# Patient Record
Sex: Male | Born: 1970 | Hispanic: No | Marital: Married | Smoking: Current every day smoker
Health system: Southern US, Community
[De-identification: ages and names within clinical notes are randomized; demographics above are authoritative.]

---

## 1998-12-01 ENCOUNTER — Encounter: Payer: Self-pay | Admitting: Emergency Medicine

## 1998-12-01 ENCOUNTER — Emergency Department (HOSPITAL_COMMUNITY): Admission: EM | Admit: 1998-12-01 | Discharge: 1998-12-01 | Payer: Self-pay | Admitting: Emergency Medicine

## 1999-09-25 ENCOUNTER — Encounter: Payer: Self-pay | Admitting: Emergency Medicine

## 1999-09-25 ENCOUNTER — Emergency Department (HOSPITAL_COMMUNITY): Admission: EM | Admit: 1999-09-25 | Discharge: 1999-09-25 | Payer: Self-pay | Admitting: Emergency Medicine

## 1999-09-28 ENCOUNTER — Emergency Department (HOSPITAL_COMMUNITY): Admission: EM | Admit: 1999-09-28 | Discharge: 1999-09-28 | Payer: Self-pay | Admitting: Emergency Medicine

## 2001-10-21 ENCOUNTER — Emergency Department (HOSPITAL_COMMUNITY): Admission: EM | Admit: 2001-10-21 | Discharge: 2001-10-21 | Payer: Self-pay | Admitting: Emergency Medicine

## 2001-10-21 ENCOUNTER — Encounter: Payer: Self-pay | Admitting: Emergency Medicine

## 2003-06-09 ENCOUNTER — Ambulatory Visit (HOSPITAL_COMMUNITY): Admission: RE | Admit: 2003-06-09 | Discharge: 2003-06-09 | Payer: Self-pay | Admitting: Internal Medicine

## 2005-04-19 ENCOUNTER — Ambulatory Visit: Payer: Self-pay | Admitting: Family Medicine

## 2005-04-27 ENCOUNTER — Ambulatory Visit: Payer: Self-pay | Admitting: *Deleted

## 2005-05-02 ENCOUNTER — Ambulatory Visit: Payer: Self-pay | Admitting: Family Medicine

## 2005-08-28 ENCOUNTER — Ambulatory Visit: Payer: Self-pay | Admitting: Family Medicine

## 2007-03-15 ENCOUNTER — Ambulatory Visit: Payer: Self-pay | Admitting: Internal Medicine

## 2007-03-15 LAB — CONVERTED CEMR LAB
ALT: 66 units/L — ABNORMAL HIGH (ref 0–53)
AST: 32 units/L (ref 0–37)
Basophils Absolute: 0 10*3/uL (ref 0.0–0.1)
Basophils Relative: 0 % (ref 0–1)
Chloride: 104 meq/L (ref 96–112)
Creatinine, Ser: 0.73 mg/dL (ref 0.40–1.50)
Eosinophils Relative: 3 % (ref 0–5)
Hemoglobin: 16 g/dL (ref 13.0–17.0)
LDL Cholesterol: 129 mg/dL — ABNORMAL HIGH (ref 0–99)
MCHC: 34.7 g/dL (ref 30.0–36.0)
Monocytes Absolute: 0.6 10*3/uL (ref 0.2–0.7)
Neutro Abs: 3.8 10*3/uL (ref 1.7–7.7)
RDW: 11.8 % (ref 11.5–14.0)
Total Bilirubin: 0.7 mg/dL (ref 0.3–1.2)
Total CHOL/HDL Ratio: 4.4
VLDL: 39 mg/dL (ref 0–40)

## 2007-04-02 ENCOUNTER — Ambulatory Visit: Payer: Self-pay | Admitting: Internal Medicine

## 2007-06-25 ENCOUNTER — Ambulatory Visit: Payer: Self-pay | Admitting: Internal Medicine

## 2007-06-25 LAB — CONVERTED CEMR LAB
AST: 42 units/L — ABNORMAL HIGH (ref 0–37)
Albumin: 4.7 g/dL (ref 3.5–5.2)
BUN: 11 mg/dL (ref 6–23)
Basophils Relative: 0 % (ref 0–1)
Calcium: 10 mg/dL (ref 8.4–10.5)
Chloride: 103 meq/L (ref 96–112)
Glucose, Bld: 114 mg/dL — ABNORMAL HIGH (ref 70–99)
HDL: 52 mg/dL (ref >39)
Helicobacter Pylori Antibody-IgG: 1.1 — ABNORMAL HIGH
Lymphs Abs: 2.7 10*3/uL (ref 0.7–4.0)
Monocytes Relative: 7 % (ref 3–12)
Neutro Abs: 3.7 10*3/uL (ref 1.7–7.7)
Neutrophils Relative %: 51 % (ref 43–77)
Potassium: 3.9 meq/L (ref 3.5–5.3)
RBC: 5.52 M/uL (ref 4.22–5.81)
Total Protein: 7.9 g/dL (ref 6.0–8.3)
WBC: 7.2 10*3/uL (ref 4.0–10.5)

## 2007-06-26 ENCOUNTER — Ambulatory Visit (HOSPITAL_COMMUNITY): Admission: RE | Admit: 2007-06-26 | Discharge: 2007-06-26 | Payer: Self-pay | Admitting: Family Medicine

## 2007-08-05 ENCOUNTER — Ambulatory Visit: Payer: Self-pay | Admitting: Internal Medicine

## 2007-08-05 LAB — CONVERTED CEMR LAB
HCV Ab: NEGATIVE
Hep A Total Ab: POSITIVE — AB
Hep B S Ab: NEGATIVE

## 2007-11-18 ENCOUNTER — Ambulatory Visit: Payer: Self-pay | Admitting: Internal Medicine

## 2007-11-18 ENCOUNTER — Encounter (INDEPENDENT_AMBULATORY_CARE_PROVIDER_SITE_OTHER): Payer: Self-pay | Admitting: Family Medicine

## 2007-11-18 LAB — CONVERTED CEMR LAB
AST: 38 units/L — ABNORMAL HIGH (ref 0–37)
Albumin: 4.7 g/dL (ref 3.5–5.2)
Alkaline Phosphatase: 105 units/L (ref 39–117)
BUN: 10 mg/dL (ref 6–23)
Creatinine, Ser: 0.74 mg/dL (ref 0.40–1.50)
HDL: 54 mg/dL (ref >39)
LDL Cholesterol: 70 mg/dL (ref 0–99)
Potassium: 4.4 meq/L (ref 3.5–5.3)
Total Bilirubin: 0.7 mg/dL (ref 0.3–1.2)
Total CHOL/HDL Ratio: 3.1
Triglycerides: 220 mg/dL — ABNORMAL HIGH (ref <150)
VLDL: 44 mg/dL — ABNORMAL HIGH (ref 0–40)

## 2008-12-17 ENCOUNTER — Encounter: Admission: RE | Admit: 2008-12-17 | Discharge: 2008-12-17 | Payer: Self-pay | Admitting: Internal Medicine

## 2010-04-20 ENCOUNTER — Emergency Department (HOSPITAL_COMMUNITY)
Admission: EM | Admit: 2010-04-20 | Discharge: 2010-04-20 | Payer: Self-pay | Source: Home / Self Care | Admitting: Emergency Medicine

## 2010-08-09 LAB — URINALYSIS, ROUTINE W REFLEX MICROSCOPIC
Bilirubin Urine: NEGATIVE
Ketones, ur: NEGATIVE mg/dL
Nitrite: NEGATIVE
Protein, ur: NEGATIVE mg/dL
Urobilinogen, UA: 0.2 mg/dL (ref 0.0–1.0)

## 2010-08-09 LAB — CBC
MCH: 32.1 pg (ref 26.0–34.0)
MCHC: 34.9 g/dL (ref 30.0–36.0)
Platelets: 189 10*3/uL (ref 150–400)
RDW: 12.1 % (ref 11.5–15.5)

## 2010-08-09 LAB — BASIC METABOLIC PANEL
BUN: 6 mg/dL (ref 6–23)
CO2: 23 mEq/L (ref 19–32)
Calcium: 9.2 mg/dL (ref 8.4–10.5)
GFR calc non Af Amer: >60 mL/min (ref >60)
Glucose, Bld: 93 mg/dL (ref 70–99)

## 2010-08-09 LAB — POCT CARDIAC MARKERS
CKMB, poc: <1 ng/mL — ABNORMAL LOW (ref 1.0–8.0)
CKMB, poc: <1 ng/mL — ABNORMAL LOW (ref 1.0–8.0)
Myoglobin, poc: 46.3 ng/mL (ref 12–200)

## 2010-08-09 LAB — HEPATIC FUNCTION PANEL
Albumin: 3.8 g/dL (ref 3.5–5.2)
Indirect Bilirubin: 0.6 mg/dL (ref 0.3–0.9)
Total Protein: 6.8 g/dL (ref 6.0–8.3)

## 2010-08-09 LAB — DIFFERENTIAL
Basophils Relative: 1 % (ref 0–1)
Eosinophils Absolute: 0.2 10*3/uL (ref 0.0–0.7)
Neutrophils Relative %: 58 % (ref 43–77)

## 2010-08-09 LAB — PROTIME-INR
INR: 1.02 (ref 0.00–1.49)
Prothrombin Time: 13.6 seconds (ref 11.6–15.2)

## 2014-07-21 ENCOUNTER — Ambulatory Visit (INDEPENDENT_AMBULATORY_CARE_PROVIDER_SITE_OTHER): Payer: PRIVATE HEALTH INSURANCE | Admitting: Family Medicine

## 2014-07-21 ENCOUNTER — Ambulatory Visit: Payer: Self-pay

## 2014-07-21 VITALS — BP 146/78 | HR 76 | Temp 98.4°F | Resp 17 | Ht 70.0 in | Wt 216.0 lb

## 2014-07-21 DIAGNOSIS — L03811 Cellulitis of head [any part, except face]: Secondary | ICD-10-CM

## 2014-07-21 MED ORDER — DOXYCYCLINE HYCLATE 100 MG PO CAPS: 100.0000 mg | ORAL_CAPSULE | Freq: Two times a day (BID) | ORAL | Status: DC

## 2014-07-21 NOTE — Patient Instructions (Addendum)
Take the doxycycline one twice daily  Use warm compresses over the area on the scalp that is infected  Return at anytime if worse.  Come back if we can be of further assistance on other problems.

## 2014-07-21 NOTE — Progress Notes (Signed)
Verbal Consent Obtained. Local anesthesia with 2 cc of 2% lidocaine with epi.  11 blade used to incise the lesion centrally.  No purulence was expressed. Culture obtained. Small incision made so no packing placed. Cleansed. No dressing applied as wound was not bleeding upon pt leaving.

## 2014-07-21 NOTE — Progress Notes (Signed)
Subjective: [age] year old [place] man who is here because he noticed a lump on the back of his scalp on the left. This was first noted a few days ago. It has been tender and getting a little larger.  Objective: 3-4 cm area of superficial induration and erythema. It is tender to touch. It is shiny in some areas. It is not coming to a head yet. The hair is still present over the whole thing. It is on the left occipital region.  Assessment: Early abscess left scalp  Plan: I&D Doxycycline twice a day

## 2014-07-24 LAB — WOUND CULTURE
Gram Stain: NONE SEEN
Gram Stain: NONE SEEN
Organism ID, Bacteria: NO GROWTH

## 2014-12-03 ENCOUNTER — Emergency Department (HOSPITAL_COMMUNITY): Payer: Self-pay

## 2014-12-03 ENCOUNTER — Emergency Department (HOSPITAL_COMMUNITY)
Admission: EM | Admit: 2014-12-03 | Discharge: 2014-12-03 | Disposition: A | Discharge status: Home / Self Care | Payer: Self-pay | Attending: Emergency Medicine | Admitting: Emergency Medicine

## 2014-12-03 ENCOUNTER — Encounter (HOSPITAL_COMMUNITY): Payer: Self-pay | Admitting: Emergency Medicine

## 2014-12-03 DIAGNOSIS — Z720 Tobacco use: Secondary | ICD-10-CM | POA: Insufficient documentation

## 2014-12-03 DIAGNOSIS — Z792 Long term (current) use of antibiotics: Secondary | ICD-10-CM | POA: Insufficient documentation

## 2014-12-03 DIAGNOSIS — S3992XA Unspecified injury of lower back, initial encounter: Secondary | ICD-10-CM | POA: Insufficient documentation

## 2014-12-03 DIAGNOSIS — Y9389 Activity, other specified: Secondary | ICD-10-CM | POA: Insufficient documentation

## 2014-12-03 DIAGNOSIS — Y998 Other external cause status: Secondary | ICD-10-CM | POA: Insufficient documentation

## 2014-12-03 DIAGNOSIS — S199XXA Unspecified injury of neck, initial encounter: Secondary | ICD-10-CM | POA: Insufficient documentation

## 2014-12-03 DIAGNOSIS — R42 Dizziness and giddiness: Secondary | ICD-10-CM | POA: Insufficient documentation

## 2014-12-03 DIAGNOSIS — Y92410 Unspecified street and highway as the place of occurrence of the external cause: Secondary | ICD-10-CM | POA: Insufficient documentation

## 2014-12-03 MED ORDER — IBUPROFEN 800 MG PO TABS: 800.0000 mg | ORAL_TABLET | Freq: Three times a day (TID) | ORAL | Status: DC

## 2014-12-03 MED ORDER — METHOCARBAMOL 500 MG PO TABS: 500.0000 mg | ORAL_TABLET | Freq: Two times a day (BID) | ORAL | Status: DC

## 2014-12-03 NOTE — ED Notes (Signed)
Pt reports pain up neck and midback. Denies LOC as scene. Denies head injury. No air back deployment
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
## 2014-12-03 NOTE — Discharge Instructions (Signed)

## 2018-01-14 ENCOUNTER — Ambulatory Visit: Payer: Self-pay | Admitting: Emergency Medicine

## 2018-01-14 ENCOUNTER — Encounter: Payer: Self-pay | Admitting: Emergency Medicine

## 2018-01-14 ENCOUNTER — Other Ambulatory Visit: Payer: Self-pay

## 2018-01-14 VITALS — BP 134/80 | HR 83 | Temp 98.4°F | Resp 16 | Ht 70.0 in | Wt 212.0 lb

## 2018-01-14 DIAGNOSIS — B359 Dermatophytosis, unspecified: Secondary | ICD-10-CM

## 2018-01-14 DIAGNOSIS — R21 Rash and other nonspecific skin eruption: Secondary | ICD-10-CM | POA: Insufficient documentation

## 2018-01-14 MED ORDER — CLOTRIMAZOLE-BETAMETHASONE 1-0.05 % EX CREA
1.0000 | TOPICAL_CREAM | Freq: Two times a day (BID) | CUTANEOUS | 2 refills | Status: DC
Start: 2018-01-14 — End: 2020-05-13

## 2018-01-14 NOTE — Patient Instructions (Addendum)
   If you have lab work done today you will be contacted with your lab results within the next 2 weeks.  If you have not heard from us then please contact us. The fastest way to get your results is to register for My Chart.   IF you received an x-ray today, you will receive an invoice from [facility]. Please contact [facility] at [phone] with questions or concerns regarding your invoice.   IF you received labwork today, you will receive an invoice from [facility]. Please contact [facility] at [phone] with questions or concerns regarding your invoice.   Our billing staff will not be able to assist you with questions regarding bills from these companies.  You will be contacted with the lab results as soon as they are available. The fastest way to get your results is to activate your My Chart account. Instructions are located on the last page of this paperwork. If you have not heard from us regarding the results in 2 weeks, please contact this office.     Body Ringworm Body ringworm is an infection of the skin that often causes a ring-shaped rash. Body ringworm can affect any part of your skin. It can spread easily to others. Body ringworm is also called tinea corporis. What are the causes? This condition is caused by funguses called dermatophytes. The condition develops when these funguses grow out of control on the skin. You can get this condition if you touch a person or animal that has it. You can also get it if you share clothing, bedding, towels, or any other object with an infected person or pet. What increases the risk? This condition is more likely to develop in:  Athletes who often make skin-to-skin contact with other athletes, such as wrestlers.  People who share equipment and mats.  People with a weakened immune system.  What are the signs or symptoms? Symptoms of this condition include:  Itchy, raised red spots and bumps.  Red scaly  patches.  A ring-shaped rash. The rash may have: ? A clear center. ? Scales or red bumps at its center. ? Redness near its borders. ? Dry and scaly skin on or around it.  How is this diagnosed? This condition can usually be diagnosed with a skin exam. A skin scraping may be taken from the affected area and examined under a microscope to see if the fungus is present. How is this treated? This condition may be treated with:  An antifungal cream or ointment.  An antifungal shampoo.  Antifungal medicines. These may be prescribed if your ringworm is severe, keeps coming back, or lasts a long time.  Follow these instructions at home:  Take over-the-counter and prescription medicines only as told by your health care provider.  If you were given an antifungal cream or ointment: ? Use it as told by your health care provider. ? Wash the infected area and dry it completely before applying the cream or ointment.  If you were given an antifungal shampoo: ? Use it as told by your health care provider. ? Leave the shampoo on your body for 3-5 minutes before rinsing.  While you have a rash: ? Wear loose clothing to stop clothes from rubbing and irritating it. ? Wash or change your bed sheets every night.  If your pet has the same infection, take your pet to see a veterinarian. How is this prevented?  Practice good hygiene.  Wear sandals or shoes in public places and showers.    Do not share personal items with others.  Avoid touching red patches of skin on other people.  Avoid touching pets that have bald spots.  If you touch an animal that has a bald spot, wash your hands. Contact a health care provider if:  Your rash continues to spread after 7 days of treatment.  Your rash is not gone in 4 weeks.  The area around your rash gets red, warm, tender, and swollen. This information is not intended to replace advice given to you by your health care provider. Make sure you discuss  any questions you have with your health care provider. Document Released: 05/12/2000 Document Revised: 10/21/2015 Document Reviewed: 03/11/2015 Elsevier Interactive Patient Education  2018 Elsevier Inc.  

## 2018-01-14 NOTE — Progress Notes (Signed)
Matthew Weeks 47 y.o.   Chief Complaint  Patient presents with  . Rash    small circluar rash on rt leg/ x 2 month. dose not itch or hurt    HISTORY OF PRESENT ILLNESS: This is a 47 y.o. male complaining of itchy rash to his right lower leg for the past 2 months.  Denies trauma although it did start with a slight cut.  No other significant symptoms.  No history of diabetes or any other chronic medical problem.  HPI   Prior to Admission medications   Medication Sig Start Date End Date Taking? Authorizing Provider  doxycycline (VIBRAMYCIN) 100 MG capsule Take 1 capsule (100 mg total) by mouth 2 (two) times daily. Patient not taking: Reported on 12/03/2014 07/21/14   Matthew Weeks, [name], MD  ibuprofen (ADVIL,MOTRIN) 800 MG tablet Take 1 tablet (800 mg total) by mouth 3 (three) times daily. Patient not taking: Reported on 01/14/2018 12/03/14   [name], [name], [name]  methocarbamol (ROBAXIN) 500 MG tablet Take 1 tablet (500 mg total) by mouth 2 (two) times daily. Patient not taking: Reported on 01/14/2018 12/03/14   [name], [name], [name]    No Known Allergies  There are no active problems to display for this patient.   No past medical history on file.  No past surgical history on file.  Social History   Socioeconomic History  . Marital status: Married    Spouse name: Not on file  . Number of children: Not on file  . Years of education: Not on file  . Highest education level: Not on file  Occupational History  . Not on file  Social Needs  . Financial resource strain: Not on file  . Food insecurity:    Worry: Not on file    Inability: Not on file  . Transportation needs:    Medical: Not on file    Non-medical: Not on file  Tobacco Use  . Smoking status: Current Every Day Smoker    Packs/day: 1.00    Years: 20.00    Pack years: 20.00    Types: Cigarettes  Substance and Sexual Activity  . Alcohol use: No    Alcohol/week: 0.0 standard drinks  . Drug use: Not on file  . Sexual  activity: Not on file  Lifestyle  . Physical activity:    Days per week: Not on file    Minutes per session: Not on file  . Stress: Not on file  Relationships  . Social connections:    Talks on phone: Not on file    Gets together: Not on file    Attends religious service: Not on file    Active member of club or organization: Not on file    Attends meetings of clubs or organizations: Not on file    Relationship status: Not on file  . Intimate partner violence:    Fear of current or ex partner: Not on file    Emotionally abused: Not on file    Physically abused: Not on file    Forced sexual activity: Not on file  Other Topics Concern  . Not on file  Social History Narrative  . Not on file    No family history on file.   Review of Systems  Constitutional: Negative.  Negative for chills and fever.  HENT: Negative.  Negative for sore throat.   Eyes: Negative.  Negative for discharge and redness.  Respiratory: Negative.  Negative for cough and shortness of breath.   Cardiovascular: Negative.  Negative for chest pain and palpitations.  Gastrointestinal: Negative for abdominal pain, diarrhea, nausea and vomiting.  Genitourinary: Negative.  Negative for dysuria.  Musculoskeletal: Negative for back pain, myalgias and neck pain.  Skin: Positive for itching and rash.  Neurological: Negative for dizziness and headaches.  All other systems reviewed and are negative.   Vitals:   01/14/18 0853  BP: 134/80  Pulse: 83  Resp: 16  Temp: 98.4 F (36.9 C)  SpO2: 98%    Physical Exam  Constitutional: He is oriented to person, place, and time. He appears well-developed and well-nourished.  HENT:  Head: Normocephalic.  Eyes: Pupils are equal, round, and reactive to light.  Neck: Normal range of motion.  Cardiovascular: Normal rate and regular rhythm.  Pulmonary/Chest: Effort normal.  Musculoskeletal: Normal range of motion. He exhibits no edema or tenderness.  Neurological: He is  alert and oriented to person, place, and time.  Skin: Skin is warm and dry. Capillary refill takes less than 2 seconds. Rash noted.  Psychiatric: He has a normal mood and affect. His behavior is normal.  Vitals reviewed.  .    ASSESSMENT & PLAN: [name] was seen today for rash.  Diagnoses and all orders for this visit:  Ringworm -     clotrimazole-betamethasone (LOTRISONE) cream; Apply 1 application topically 2 (two) times daily.  Rash and nonspecific skin eruption    Patient Instructions       If you have lab work done today you will be contacted with your lab results within the next 2 weeks.  If you have not heard from [place] then please contact us. The fastest way to get your results is to register for My Chart.   IF you received an x-ray today, you will receive an invoice from [facility]. Please contact [facility] at [phone] with questions or concerns regarding your invoice.   IF you received labwork today, you will receive an invoice from [facility]. Please contact [facility] at [phone] with questions or concerns regarding your invoice.   Our billing staff will not be able to assist you with questions regarding bills from these companies.  You will be contacted with the lab results as soon as they are available. The fastest way to get your results is to activate your My Chart account. Instructions are located on the last page of this paperwork. If you have not heard from [place] regarding the results in 2 weeks, please contact this office.      Body Ringworm Body ringworm is an infection of the skin that often causes a ring-shaped rash. Body ringworm can affect any part of your skin. It can spread easily to others. Body ringworm is also called tinea corporis. What are the causes? This condition is caused by funguses called dermatophytes. The condition develops when these funguses grow out of control on the skin. You can get this condition if you touch  a person or animal that has it. You can also get it if you share clothing, bedding, towels, or any other object with an infected person or pet. What increases the risk? This condition is more likely to develop in:  Athletes who often make skin-to-skin contact with other athletes, such as wrestlers.  People who share equipment and mats.  People with a weakened immune system.  What are the signs or symptoms? Symptoms of this condition include:  Itchy, raised red spots and bumps.  Red scaly patches.  A ring-shaped rash. The rash may have: ? A clear center. ?  Scales or red bumps at its center. ? Redness near its borders. ? Dry and scaly skin on or around it.  How is this diagnosed? This condition can usually be diagnosed with a skin exam. A skin scraping may be taken from the affected area and examined under a microscope to see if the fungus is present. How is this treated? This condition may be treated with:  An antifungal cream or ointment.  An antifungal shampoo.  Antifungal medicines. These may be prescribed if your ringworm is severe, keeps coming back, or lasts a long time.  Follow these instructions at home:  Take over-the-counter and prescription medicines only as told by your health care provider.  If you were given an antifungal cream or ointment: ? Use it as told by your health care provider. ? Wash the infected area and dry it completely before applying the cream or ointment.  If you were given an antifungal shampoo: ? Use it as told by your health care provider. ? Leave the shampoo on your body for 3-5 minutes before rinsing.  While you have a rash: ? Wear loose clothing to stop clothes from rubbing and irritating it. ? Wash or change your bed sheets every night.  If your pet has the same infection, take your pet to see a International aid/development workerveterinarian. How is this prevented?  Practice good hygiene.  Wear sandals or shoes in public places and showers.  Do not share  personal items with others.  Avoid touching red patches of skin on other people.  Avoid touching pets that have bald spots.  If you touch an animal that has a bald spot, wash your hands. Contact a health care provider if:  Your rash continues to spread after 7 days of treatment.  Your rash is not gone in 4 weeks.  The area around your rash gets red, warm, tender, and swollen. This information is not intended to replace advice given to you by your health care provider. Make sure you discuss any questions you have with your health care provider. Document Released: 05/12/2000 Document Revised: 10/21/2015 Document Reviewed: 03/11/2015 Elsevier Interactive Patient Education  2018 Elsevier Inc.      Edwina BarthMiguel Nayda Riesen, MD [facility]

## 2018-08-15 ENCOUNTER — Other Ambulatory Visit: Payer: Self-pay

## 2018-08-15 ENCOUNTER — Emergency Department (HOSPITAL_COMMUNITY): Payer: Self-pay

## 2018-08-15 ENCOUNTER — Emergency Department (HOSPITAL_COMMUNITY)
Admission: EM | Admit: 2018-08-15 | Discharge: 2018-08-15 | Disposition: A | Discharge status: Home / Self Care | Payer: Self-pay | Attending: Emergency Medicine | Admitting: Emergency Medicine

## 2018-08-15 ENCOUNTER — Encounter (HOSPITAL_COMMUNITY): Payer: Self-pay

## 2018-08-15 DIAGNOSIS — Y92410 Unspecified street and highway as the place of occurrence of the external cause: Secondary | ICD-10-CM | POA: Insufficient documentation

## 2018-08-15 DIAGNOSIS — Z79899 Other long term (current) drug therapy: Secondary | ICD-10-CM | POA: Insufficient documentation

## 2018-08-15 DIAGNOSIS — F17210 Nicotine dependence, cigarettes, uncomplicated: Secondary | ICD-10-CM | POA: Insufficient documentation

## 2018-08-15 DIAGNOSIS — Y9389 Activity, other specified: Secondary | ICD-10-CM | POA: Insufficient documentation

## 2018-08-15 DIAGNOSIS — S46912A Strain of unspecified muscle, fascia and tendon at shoulder and upper arm level, left arm, initial encounter: Secondary | ICD-10-CM | POA: Insufficient documentation

## 2018-08-15 DIAGNOSIS — Y999 Unspecified external cause status: Secondary | ICD-10-CM | POA: Insufficient documentation

## 2018-08-15 MED ORDER — IBUPROFEN 800 MG PO TABS: 800.0000 mg | ORAL_TABLET | Freq: Three times a day (TID) | ORAL | 0 refills | Status: DC | PRN
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
## 2019-01-14 ENCOUNTER — Ambulatory Visit: Payer: Self-pay | Admitting: Emergency Medicine

## 2019-01-15 ENCOUNTER — Ambulatory Visit: Payer: Self-pay | Admitting: Emergency Medicine

## 2019-01-16 ENCOUNTER — Other Ambulatory Visit: Payer: Self-pay

## 2019-01-16 ENCOUNTER — Ambulatory Visit: Payer: Self-pay | Admitting: Emergency Medicine

## 2019-01-16 ENCOUNTER — Ambulatory Visit (INDEPENDENT_AMBULATORY_CARE_PROVIDER_SITE_OTHER): Payer: Self-pay

## 2019-01-16 ENCOUNTER — Ambulatory Visit (INDEPENDENT_AMBULATORY_CARE_PROVIDER_SITE_OTHER): Payer: Self-pay | Admitting: Emergency Medicine

## 2019-01-16 ENCOUNTER — Encounter: Payer: Self-pay | Admitting: Emergency Medicine

## 2019-01-16 VITALS — BP 122/77 | HR 76 | Temp 98.1°F | Resp 16 | Ht 70.0 in | Wt 205.6 lb

## 2019-01-16 DIAGNOSIS — S43401A Unspecified sprain of right shoulder joint, initial encounter: Secondary | ICD-10-CM

## 2019-01-16 DIAGNOSIS — M25511 Pain in right shoulder: Secondary | ICD-10-CM

## 2019-01-16 MED ORDER — MELOXICAM 7.5 MG PO TABS: 7.5000 mg | ORAL_TABLET | Freq: Every day | ORAL | 0 refills | Status: DC
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
## 2019-01-16 NOTE — Patient Instructions (Addendum)
   If you have lab work done today you will be contacted with your lab results within the next 2 weeks.  If you have not heard from us then please contact us. The fastest way to get your results is to register for My Chart.   IF you received an x-ray today, you will receive an invoice from [facility]. Please contact [facility] at [phone] with questions or concerns regarding your invoice.   IF you received labwork today, you will receive an invoice from [facility]. Please contact [facility] at [phone] with questions or concerns regarding your invoice.   Our billing staff will not be able to assist you with questions regarding bills from these companies.  You will be contacted with the lab results as soon as they are available. The fastest way to get your results is to activate your My Chart account. Instructions are located on the last page of this paperwork. If you have not heard from us regarding the results in 2 weeks, please contact this office.     Shoulder Pain Many things can cause shoulder pain, including:  An injury.  Moving the shoulder in the same way again and again (overuse).  Joint pain (arthritis). Pain can come from:  Swelling and irritation (inflammation) of any part of the shoulder.  An injury to the shoulder joint.  An injury to: ? Tissues that connect muscle to bone (tendons). ? Tissues that connect bones to each other (ligaments). ? Bones. Follow these instructions at home: Watch for changes in your symptoms. Let your doctor know about them. Follow these instructions to help with your pain. If you have a sling:  Wear the sling as told by your doctor. Remove it only as told by your doctor.  Loosen the sling if your fingers: ? Tingle. ? Become numb. ? Turn cold and blue.  Keep the sling clean.  If the sling is not waterproof: ? Do not let it get wet. ? Take the sling off when you shower or bathe. Managing pain, stiffness,  and swelling   If told, put ice on the painful area: ? Put ice in a plastic bag. ? Place a towel between your skin and the bag. ? Leave the ice on for 20 minutes, 2-3 times a day. Stop putting ice on if it does not help with the pain.  Squeeze a soft ball or a foam pad as much as possible. This prevents swelling in the shoulder. It also helps to strengthen the arm. General instructions  Take over-the-counter and prescription medicines only as told by your doctor.  Keep all follow-up visits as told by your doctor. This is important. Contact a doctor if:  Your pain gets worse.  Medicine does not help your pain.  You have new pain in your arm, hand, or fingers. Get help right away if:  Your arm, hand, or fingers: ? Tingle. ? Are numb. ? Are swollen. ? Are painful. ? Turn white or blue. Summary  Shoulder pain can be caused by many things. These include injury, moving the shoulder in the same away again and again, and joint pain.  Watch for changes in your symptoms. Let your doctor know about them.  This condition may be treated with a sling, ice, and pain medicine.  Contact your doctor if the pain gets worse or you have new pain. Get help right away if your arm, hand, or fingers tingle or get numb, swollen, or painful.  Keep all follow-up visits   as told by your doctor. This is important. This information is not intended to replace advice given to you by your health care provider. Make sure you discuss any questions you have with your health care provider. Document Released: 11/01/2007 Document Revised: 11/27/2017 Document Reviewed: 11/27/2017 Elsevier Patient Education  2020 [employer]  

## 2019-07-03 DIAGNOSIS — Z20822 Contact with and (suspected) exposure to covid-19: Secondary | ICD-10-CM | POA: Diagnosis not present

## 2019-07-03 DIAGNOSIS — Z20828 Contact with and (suspected) exposure to other viral communicable diseases: Secondary | ICD-10-CM | POA: Diagnosis not present

## 2019-09-18 DIAGNOSIS — Z87891 Personal history of nicotine dependence: Secondary | ICD-10-CM | POA: Diagnosis not present

## 2019-09-18 DIAGNOSIS — R55 Syncope and collapse: Secondary | ICD-10-CM | POA: Diagnosis not present

## 2019-09-18 DIAGNOSIS — R9431 Abnormal electrocardiogram [ECG] [EKG]: Secondary | ICD-10-CM | POA: Diagnosis not present

## 2019-09-18 DIAGNOSIS — Z79899 Other long term (current) drug therapy: Secondary | ICD-10-CM | POA: Diagnosis not present

## 2019-09-18 DIAGNOSIS — F17210 Nicotine dependence, cigarettes, uncomplicated: Secondary | ICD-10-CM | POA: Diagnosis not present

## 2019-09-18 DIAGNOSIS — R001 Bradycardia, unspecified: Secondary | ICD-10-CM | POA: Diagnosis not present

## 2019-09-18 DIAGNOSIS — M479 Spondylosis, unspecified: Secondary | ICD-10-CM | POA: Diagnosis not present

## 2019-09-18 DIAGNOSIS — R531 Weakness: Secondary | ICD-10-CM | POA: Diagnosis not present

## 2019-09-18 DIAGNOSIS — R42 Dizziness and giddiness: Secondary | ICD-10-CM | POA: Diagnosis not present

## 2019-09-18 DIAGNOSIS — R4020 Unspecified coma: Secondary | ICD-10-CM | POA: Diagnosis not present

## 2019-09-25 ENCOUNTER — Encounter: Payer: Self-pay | Admitting: Emergency Medicine

## 2019-09-25 ENCOUNTER — Other Ambulatory Visit: Payer: Self-pay

## 2019-09-25 ENCOUNTER — Ambulatory Visit: Payer: BC Managed Care – PPO | Admitting: Emergency Medicine

## 2019-09-25 ENCOUNTER — Telehealth: Payer: Self-pay | Admitting: *Deleted

## 2019-09-25 VITALS — BP 126/78 | HR 60 | Temp 97.4°F | Resp 16 | Ht 70.0 in | Wt 209.0 lb

## 2019-09-25 DIAGNOSIS — F17200 Nicotine dependence, unspecified, uncomplicated: Secondary | ICD-10-CM | POA: Diagnosis not present

## 2019-09-25 DIAGNOSIS — Z1321 Encounter for screening for nutritional disorder: Secondary | ICD-10-CM

## 2019-09-25 DIAGNOSIS — Z1211 Encounter for screening for malignant neoplasm of colon: Secondary | ICD-10-CM | POA: Diagnosis not present

## 2019-09-25 DIAGNOSIS — Z13220 Encounter for screening for lipoid disorders: Secondary | ICD-10-CM

## 2019-09-25 DIAGNOSIS — Z716 Tobacco abuse counseling: Secondary | ICD-10-CM

## 2019-09-25 DIAGNOSIS — Z130 Encounter for screening for diseases of the blood and blood-forming organs and certain disorders involving the immune mechanism: Secondary | ICD-10-CM

## 2019-09-25 DIAGNOSIS — Z1329 Encounter for screening for other suspected endocrine disorder: Secondary | ICD-10-CM

## 2019-09-25 DIAGNOSIS — Z13228 Encounter for screening for other metabolic disorders: Secondary | ICD-10-CM

## 2019-09-25 LAB — LIPID PANEL
Chol/HDL Ratio: 3.5 ratio (ref 0.0–5.0)
Cholesterol, Total: 184 mg/dL (ref 100–199)
HDL: 53 mg/dL (ref >39)
LDL Chol Calc (NIH): 113 mg/dL — ABNORMAL HIGH (ref 0–99)
Triglycerides: 99 mg/dL (ref 0–149)
VLDL Cholesterol Cal: 18 mg/dL (ref 5–40)

## 2019-09-25 LAB — COMPREHENSIVE METABOLIC PANEL
ALT: 41 IU/L (ref 0–44)
AST: 27 IU/L (ref 0–40)
Albumin/Globulin Ratio: 1.8 (ref 1.2–2.2)
Albumin: 4.4 g/dL (ref 4.0–5.0)
Alkaline Phosphatase: 101 IU/L (ref 39–117)
BUN/Creatinine Ratio: 10 (ref 9–20)
BUN: 8 mg/dL (ref 6–24)
Bilirubin Total: 0.6 mg/dL (ref 0.0–1.2)
CO2: 18 mmol/L — ABNORMAL LOW (ref 20–29)
Calcium: 9.3 mg/dL (ref 8.7–10.2)
Chloride: 103 mmol/L (ref 96–106)
Creatinine, Ser: 0.77 mg/dL (ref 0.76–1.27)
GFR calc Af Amer: 122 mL/min/{1.73_m2} (ref >59)
GFR calc non Af Amer: 106 mL/min/{1.73_m2} (ref >59)
Globulin, Total: 2.4 g/dL (ref 1.5–4.5)
Glucose: 105 mg/dL — ABNORMAL HIGH (ref 65–99)
Potassium: 4 mmol/L (ref 3.5–5.2)
Sodium: 139 mmol/L (ref 134–144)
Total Protein: 6.8 g/dL (ref 6.0–8.5)

## 2019-09-25 LAB — HEMOGLOBIN A1C
Est. average glucose Bld gHb Est-mCnc: 97 mg/dL
Hgb A1c MFr Bld: 5 % (ref 4.8–5.6)

## 2019-09-25 NOTE — Patient Instructions (Addendum)
   If you have lab work done today you will be contacted with your lab results within the next 2 weeks.  If you have not heard from us then please contact us. The fastest way to get your results is to register for My Chart.   IF you received an x-ray today, you will receive an invoice from [facility]. Please contact [facility] at [phone] with questions or concerns regarding your invoice.   IF you received labwork today, you will receive an invoice from [facility]. Please contact [facility] at [phone] with questions or concerns regarding your invoice.   Our billing staff will not be able to assist you with questions regarding bills from these companies.  You will be contacted with the lab results as soon as they are available. The fastest way to get your results is to activate your My Chart account. Instructions are located on the last page of this paperwork. If you have not heard from us regarding the results in 2 weeks, please contact this office.      Health Maintenance, Male Adopting a healthy lifestyle and getting preventive care are important in promoting health and wellness. Ask your health care provider about:  The right schedule for you to have regular tests and exams.  Things you can do on your own to prevent diseases and keep yourself healthy. What should I know about diet, weight, and exercise? Eat a healthy diet   Eat a diet that includes plenty of vegetables, fruits, low-fat dairy products, and lean protein.  Do not eat a lot of foods that are high in solid fats, added sugars, or sodium. Maintain a healthy weight Body mass index (BMI) is a measurement that can be used to identify possible weight problems. It estimates body fat based on height and weight. Your health care provider can help determine your BMI and help you achieve or maintain a healthy weight. Get regular exercise Get regular exercise. This is one of the most important things you  can do for your health. Most adults should:  Exercise for at least 150 minutes each week. The exercise should increase your heart rate and make you sweat (moderate-intensity exercise).  Do strengthening exercises at least twice a week. This is in addition to the moderate-intensity exercise.  Spend less time sitting. Even light physical activity can be beneficial. Watch cholesterol and blood lipids Have your blood tested for lipids and cholesterol at 50 years of age, then have this test every 5 years. You may need to have your cholesterol levels checked more often if:  Your lipid or cholesterol levels are high.  You are older than 50 years of age.  You are at high risk for heart disease. What should I know about cancer screening? Many types of cancers can be detected early and may often be prevented. Depending on your health history and family history, you may need to have cancer screening at various ages. This may include screening for:  Colorectal cancer.  Prostate cancer.  Skin cancer.  Lung cancer. What should I know about heart disease, diabetes, and high blood pressure? Blood pressure and heart disease  High blood pressure causes heart disease and increases the risk of stroke. This is more likely to develop in people who have high blood pressure readings, are of African descent, or are overweight.  Talk with your health care provider about your target blood pressure readings.  Have your blood pressure checked: ? Every 3-5 years if you are 18-39   years of age. ? Every year if you are 40 years old or older.  If you are between the ages of 65 and 75 and are a current or former smoker, ask your health care provider if you should have a one-time screening for abdominal aortic aneurysm (AAA). Diabetes Have regular diabetes screenings. This checks your fasting blood sugar level. Have the screening done:  Once every three years after age 45 if you are at a normal weight and have  a low risk for diabetes.  More often and at a younger age if you are overweight or have a high risk for diabetes. What should I know about preventing infection? Hepatitis B If you have a higher risk for hepatitis B, you should be screened for this virus. Talk with your health care provider to find out if you are at risk for hepatitis B infection. Hepatitis C Blood testing is recommended for:  Everyone born from 1945 through 1965.  Anyone with known risk factors for hepatitis C. Sexually transmitted infections (STIs)  You should be screened each year for STIs, including gonorrhea and chlamydia, if: ? You are sexually active and are younger than 50 years of age. ? You are older than 50 years of age and your health care provider tells you that you are at risk for this type of infection. ? Your sexual activity has changed since you were last screened, and you are at increased risk for chlamydia or gonorrhea. Ask your health care provider if you are at risk.  Ask your health care provider about whether you are at high risk for HIV. Your health care provider may recommend a prescription medicine to help prevent HIV infection. If you choose to take medicine to prevent HIV, you should first get tested for HIV. You should then be tested every 3 months for as long as you are taking the medicine. Follow these instructions at home: Lifestyle  Do not use any products that contain nicotine or tobacco, such as cigarettes, e-cigarettes, and chewing tobacco. If you need help quitting, ask your health care provider.  Do not use street drugs.  Do not share needles.  Ask your health care provider for help if you need support or information about quitting drugs. Alcohol use  Do not drink alcohol if your health care provider tells you not to drink.  If you drink alcohol: ? Limit how much you have to 0-2 drinks a day. ? Be aware of how much alcohol is in your drink. In the [place] one drink equals one 12  oz bottle of beer (355 mL), one 5 oz glass of wine (148 mL), or one 1 oz glass of hard liquor (44 mL). General instructions  Schedule regular health, dental, and eye exams.  Stay current with your vaccines.  Tell your health care provider if: ? You often feel depressed. ? You have ever been abused or do not feel safe at home. Summary  Adopting a healthy lifestyle and getting preventive care are important in promoting health and wellness.  Follow your health care provider's instructions about healthy diet, exercising, and getting tested or screened for diseases.  Follow your health care provider's instructions on monitoring your cholesterol and blood pressure. This information is not intended to replace advice given to you by your health care provider. Make sure you discuss any questions you have with your health care provider. Document Revised: 05/08/2018 Document Reviewed: 05/08/2018 Elsevier Patient Education  2020 Elsevier Inc.  

## 2019-09-25 NOTE — Progress Notes (Signed)
Matthew Weeks 49 y.o.   Chief Complaint  Patient presents with  . screening for hyperlipidemia    per pt he want cholesterol checked    HISTORY OF PRESENT ILLNESS: This is a 49 y.o. male current smoker wants to be tested for cholesterol. No history of chronic medical problems. Emergency department visit to [facility] on 09/18/2019 complaining of weakness and dizziness during Ramadan season. Had extensive work-up which was negative and it included blood work, EKG, chest x-ray, CT scan of brain. Feels better today and perhaps he was dehydrated. No other complaints or medical concerns today.  HPI   Prior to Admission medications   Medication Sig Start Date End Date Taking? Authorizing Provider  ibuprofen (ADVIL,MOTRIN) 800 MG tablet Take 1 tablet (800 mg total) by mouth every 8 (eight) hours as needed. 08/15/18  Yes Tilden Fossa, MD  clotrimazole-betamethasone (LOTRISONE) cream Apply 1 application topically 2 (two) times daily. Patient not taking: Reported on 09/25/2019 01/14/18   Georgina Quint, MD  meloxicam (MOBIC) 7.5 MG tablet Take 1 tablet (7.5 mg total) by mouth daily. Patient not taking: Reported on 09/25/2019 01/16/19   Georgina Quint, MD  methocarbamol (ROBAXIN) 500 MG tablet Take 1 tablet (500 mg total) by mouth 2 (two) times daily. Patient not taking: Reported on 09/25/2019 12/03/14   Fayrene Helper, [name]    No Known Allergies  There are no problems to display for this patient.   History reviewed. No pertinent past medical history.  History reviewed. No pertinent surgical history.  Social History   Socioeconomic History  . Marital status: Married    Spouse name: Not on file  . Number of children: Not on file  . Years of education: Not on file  . Highest education level: Not on file  Occupational History  . Not on file  Tobacco Use  . Smoking status: Current Every Day Smoker    Packs/day: 1.00    Years: 20.00    Pack years: 20.00   Types: Cigarettes  . Smokeless tobacco: Never Used  Substance and Sexual Activity  . Alcohol use: No    Alcohol/week: 0.0 standard drinks  . Drug use: Never  . Sexual activity: Not on file  Other Topics Concern  . Not on file  Social History Narrative  . Not on file   Social Determinants of Health   Financial Resource Strain:   . Difficulty of Paying Living Expenses:   Food Insecurity:   . Worried About Programme researcher, broadcasting/film/video in the Last Year:   . Barista in the Last Year:   Transportation Needs:   . Freight forwarder (Medical):   Marland Kitchen Lack of Transportation (Non-Medical):   Physical Activity:   . Days of Exercise per Week:   . Minutes of Exercise per Session:   Stress:   . Feeling of Stress :   Social Connections:   . Frequency of Communication with Friends and Family:   . Frequency of Social Gatherings with Friends and Family:   . Attends Religious Services:   . Active Member of Clubs or Organizations:   . Attends Banker Meetings:   Marland Kitchen Marital Status:   Intimate Partner Violence:   . Fear of Current or Ex-Partner:   . Emotionally Abused:   Marland Kitchen Physically Abused:   . Sexually Abused:     History reviewed. No pertinent family history.   Review of Systems  Constitutional: Negative.  Negative for chills and fever.  HENT: Negative.  Negative for congestion and sore throat.   Respiratory: Negative.  Negative for cough and shortness of breath.   Cardiovascular: Negative.  Negative for chest pain and palpitations.  Gastrointestinal: Negative.  Negative for abdominal pain, blood in stool, constipation, diarrhea, melena, nausea and vomiting.  Genitourinary: Negative.  Negative for dysuria and hematuria.  Musculoskeletal: Negative for back pain, myalgias and neck pain.  Skin: Negative.  Negative for rash.  Neurological: Negative.  Negative for dizziness and headaches.  All other systems reviewed and are negative.  Today's Vitals   09/25/19 1017  BP:  126/78  Pulse: 60  Resp: 16  Temp: (!) 97.4 F (36.3 C)  TempSrc: Temporal  SpO2: 99%  Weight: 209 lb (94.8 kg)  Height: 5\' 10"  (1.778 m)   Body mass index is 29.99 kg/m.   Physical Exam Vitals reviewed.  Constitutional:      Appearance: Normal appearance.  HENT:     Head: Normocephalic.  Eyes:     Extraocular Movements: Extraocular movements intact.     Conjunctiva/sclera: Conjunctivae normal.     Pupils: Pupils are equal, round, and reactive to light.  Cardiovascular:     Rate and Rhythm: Normal rate and regular rhythm.     Pulses: Normal pulses.     Heart sounds: Normal heart sounds.  Pulmonary:     Effort: Pulmonary effort is normal.     Breath sounds: Normal breath sounds.  Abdominal:     Palpations: Abdomen is soft.     Tenderness: There is no abdominal tenderness.  Musculoskeletal:        General: Normal range of motion.     Cervical back: Normal range of motion and neck supple.  Skin:    General: Skin is warm and dry.     Capillary Refill: Capillary refill takes less than 2 seconds.  Neurological:     General: No focal deficit present.     Mental Status: He is alert and oriented to person, place, and time.  Psychiatric:        Mood and Affect: Mood normal.        Behavior: Behavior normal.    A total of 30 minutes was spent with the patient, greater than 50% of which was in counseling/coordination of care regarding health maintenance, smoking cessation, cardiovascular risk associated with smoking as well as increased risk for malignancies and COPD, review of most recent office visit notes, review of most recent blood work, need for diagnostic and screening blood work, diet and nutrition, prognosis and need for follow-up.   ASSESSMENT & PLAN: Matthew Weeks was seen today for screening for hyperlipidemia.  Diagnoses and all orders for this visit:  Current smoker  Screening for colon cancer -     Cologuard  Screening for lipoid disorders -     Lipid  panel  Screening for endocrine, nutritional, metabolic and immunity disorder -     Comprehensive metabolic panel -     Hemoglobin A1c  Tobacco abuse counseling -     Ambulatory referral to Smoking Cessation Program    Patient Instructions       If you have lab work done today you will be contacted with your lab results within the next 2 weeks.  If you have not heard from then please contact us. The fastest way to get your results is to register for My Chart.   IF you received an x-ray today, you will receive an invoice from [facility]. Please contact  [facility] at [phone] with questions or concerns regarding your invoice.   IF you received labwork today, you will receive an invoice from [facility]. Please contact [facility] at [phone] with questions or concerns regarding your invoice.   Our billing staff will not be able to assist you with questions regarding bills from these companies.  You will be contacted with the lab results as soon as they are available. The fastest way to get your results is to activate your My Chart account. Instructions are located on the last page of this paperwork. If you have not heard from [place] regarding the results in 2 weeks, please contact this office.     Health Maintenance, Male Adopting a healthy lifestyle and getting preventive care are important in promoting health and wellness. Ask your health care provider about:  The right schedule for you to have regular tests and exams.  Things you can do on your own to prevent diseases and keep yourself healthy. What should I know about diet, weight, and exercise? Eat a healthy diet   Eat a diet that includes plenty of vegetables, fruits, low-fat dairy products, and lean protein.  Do not eat a lot of foods that are high in solid fats, added sugars, or sodium. Maintain a healthy weight Body mass index (BMI) is a measurement that can be used to identify possible weight  problems. It estimates body fat based on height and weight. Your health care provider can help determine your BMI and help you achieve or maintain a healthy weight. Get regular exercise Get regular exercise. This is one of the most important things you can do for your health. Most adults should:  Exercise for at least 150 minutes each week. The exercise should increase your heart rate and make you sweat (moderate-intensity exercise).  Do strengthening exercises at least twice a week. This is in addition to the moderate-intensity exercise.  Spend less time sitting. Even light physical activity can be beneficial. Watch cholesterol and blood lipids Have your blood tested for lipids and cholesterol at 50 years of age, then have this test every 5 years. You may need to have your cholesterol levels checked more often if:  Your lipid or cholesterol levels are high.  You are older than 50 years of age.  You are at high risk for heart disease. What should I know about cancer screening? Many types of cancers can be detected early and may often be prevented. Depending on your health history and family history, you may need to have cancer screening at various ages. This may include screening for:  Colorectal cancer.  Prostate cancer.  Skin cancer.  Lung cancer. What should I know about heart disease, diabetes, and high blood pressure? Blood pressure and heart disease  High blood pressure causes heart disease and increases the risk of stroke. This is more likely to develop in people who have high blood pressure readings, are of African descent, or are overweight.  Talk with your health care provider about your target blood pressure readings.  Have your blood pressure checked: ? Every 3-5 years if you are 22-52 years of age. ? Every year if you are 53 years old or older.  If you are between the ages of 13 and 20 and are a current or former smoker, ask your health care provider if you should  have a one-time screening for abdominal aortic aneurysm (AAA). Diabetes Have regular diabetes screenings. This checks your fasting blood sugar level. Have the screening done:  Once every three years after age 54 if you are at a normal weight and have a low risk for diabetes.  More often and at a younger age if you are overweight or have a high risk for diabetes. What should I know about preventing infection? Hepatitis B If you have a higher risk for hepatitis B, you should be screened for this virus. Talk with your health care provider to find out if you are at risk for hepatitis B infection. Hepatitis C Blood testing is recommended for:  Everyone born from [birth year] through 1965.  Anyone with known risk factors for hepatitis C. Sexually transmitted infections (STIs)  You should be screened each year for STIs, including gonorrhea and chlamydia, if: ? You are sexually active and are younger than 50 years of age. ? You are older than 50 years of age and your health care provider tells you that you are at risk for this type of infection. ? Your sexual activity has changed since you were last screened, and you are at increased risk for chlamydia or gonorrhea. Ask your health care provider if you are at risk.  Ask your health care provider about whether you are at high risk for HIV. Your health care provider may recommend a prescription medicine to help prevent HIV infection. If you choose to take medicine to prevent HIV, you should first get tested for HIV. You should then be tested every 3 months for as long as you are taking the medicine. Follow these instructions at home: Lifestyle  Do not use any products that contain nicotine or tobacco, such as cigarettes, e-cigarettes, and chewing tobacco. If you need help quitting, ask your health care provider.  Do not use street drugs.  Do not share needles.  Ask your health care provider for help if you need support or information about quitting  drugs. Alcohol use  Do not drink alcohol if your health care provider tells you not to drink.  If you drink alcohol: ? Limit how much you have to 0-2 drinks a day. ? Be aware of how much alcohol is in your drink. In the [place] one drink equals one 12 oz bottle of beer (355 mL), one 5 oz glass of wine (148 mL), or one 1 oz glass of hard liquor (44 mL). General instructions  Schedule regular health, dental, and eye exams.  Stay current with your vaccines.  Tell your health care provider if: ? You often feel depressed. ? You have ever been abused or do not feel safe at home. Summary  Adopting a healthy lifestyle and getting preventive care are important in promoting health and wellness.  Follow your health care provider's instructions about healthy diet, exercising, and getting tested or screened for diseases.  Follow your health care provider's instructions on monitoring your cholesterol and blood pressure. This information is not intended to replace advice given to you by your health care provider. Make sure you discuss any questions you have with your health care provider. Document Revised: 05/08/2018 Document Reviewed: 05/08/2018 Elsevier Patient Education  2020 Elsevier Inc.      Edwina Barth, MD [facility]

## 2019-09-25 NOTE — Telephone Encounter (Signed)
Faxed Cologuard Req to [employer]. confirmation page 10:38 am.

## 2019-09-29 ENCOUNTER — Telehealth: Payer: Self-pay | Admitting: Emergency Medicine

## 2019-09-29 NOTE — Telephone Encounter (Signed)
Spoke with pt, gave lab results all is well. Also told pt that several calls were attempted and that he needs to set up vm for future communication.

## 2019-09-29 NOTE — Progress Notes (Signed)
Spoke with pt, all is well

## 2019-09-29 NOTE — Telephone Encounter (Signed)
Pt would like a call back concerning his most recent lab results. Please advise at [phone].

## 2019-10-03 ENCOUNTER — Ambulatory Visit: Payer: BC Managed Care – PPO | Admitting: Registered Nurse

## 2019-10-08 ENCOUNTER — Ambulatory Visit (INDEPENDENT_AMBULATORY_CARE_PROVIDER_SITE_OTHER): Payer: BC Managed Care – PPO | Admitting: Emergency Medicine

## 2019-10-08 ENCOUNTER — Other Ambulatory Visit: Payer: Self-pay

## 2019-10-08 ENCOUNTER — Encounter: Payer: Self-pay | Admitting: Emergency Medicine

## 2019-10-08 VITALS — BP 115/72 | HR 64 | Temp 97.1°F | Ht 70.0 in | Wt 206.4 lb

## 2019-10-08 DIAGNOSIS — F17200 Nicotine dependence, unspecified, uncomplicated: Secondary | ICD-10-CM

## 2019-10-08 DIAGNOSIS — R42 Dizziness and giddiness: Secondary | ICD-10-CM

## 2019-10-08 NOTE — Progress Notes (Signed)
Matthew Weeks 49 y.o.   Chief Complaint  Patient presents with  . Dizziness    3-4 days   . check eyes and ears    wants a check up on them to make sure they work properly    HISTORY OF PRESENT ILLNESS: This is a 49 y.o. male complaining of intermittent episodes of dizziness since Ramadan started.  Has been fasting 13-1/2 hours every day since.  Went to emergency room last month and had extensive negative and normal work-up.  Seen by me on 09/25/2019 for follow-up.  No significant findings.  Negative work-up.  Normal blood work results. Has a Dr. niece in [place] who advised to have his eyes and ears checked. No new symptoms since I saw him 2 weeks ago. No other complaints or significant medical concerns.  HPI   Prior to Admission medications   Medication Sig Start Date End Date Taking? Authorizing Provider  clotrimazole-betamethasone (LOTRISONE) cream Apply 1 application topically 2 (two) times daily. Patient not taking: Reported on 09/25/2019 01/14/18   Georgina Quint, MD  ibuprofen (ADVIL,MOTRIN) 800 MG tablet Take 1 tablet (800 mg total) by mouth every 8 (eight) hours as needed. Patient not taking: Reported on 10/08/2019 08/15/18   Tilden Fossa, MD  meloxicam (MOBIC) 7.5 MG tablet Take 1 tablet (7.5 mg total) by mouth daily. Patient not taking: Reported on 09/25/2019 01/16/19   Georgina Quint, MD  methocarbamol (ROBAXIN) 500 MG tablet Take 1 tablet (500 mg total) by mouth 2 (two) times daily. Patient not taking: Reported on 09/25/2019 12/03/14   Fayrene Helper, [name]    No Known Allergies  There are no problems to display for this patient.   History reviewed. No pertinent past medical history.  History reviewed. No pertinent surgical history.  Social History   Socioeconomic History  . Marital status: Married    Spouse name: Not on file  . Number of children: Not on file  . Years of education: Not on file  . Highest education level: Not on file  Occupational  History  . Not on file  Tobacco Use  . Smoking status: Current Every Day Smoker    Packs/day: 1.00    Years: 20.00    Pack years: 20.00    Types: Cigarettes  . Smokeless tobacco: Never Used  Substance and Sexual Activity  . Alcohol use: No    Alcohol/week: 0.0 standard drinks  . Drug use: Never  . Sexual activity: Not on file  Other Topics Concern  . Not on file  Social History Narrative  . Not on file   Social Determinants of Health   Financial Resource Strain:   . Difficulty of Paying Living Expenses:   Food Insecurity:   . Worried About Programme researcher, broadcasting/film/video in the Last Year:   . Barista in the Last Year:   Transportation Needs:   . Freight forwarder (Medical):   Marland Kitchen Lack of Transportation (Non-Medical):   Physical Activity:   . Days of Exercise per Week:   . Minutes of Exercise per Session:   Stress:   . Feeling of Stress :   Social Connections:   . Frequency of Communication with Friends and Family:   . Frequency of Social Gatherings with Friends and Family:   . Attends Religious Services:   . Active Member of Clubs or Organizations:   . Attends Banker Meetings:   Marland Kitchen Marital Status:   Intimate Partner Violence:   . Fear  of Current or Ex-Partner:   . Emotionally Abused:   Marland Kitchen Physically Abused:   . Sexually Abused:     History reviewed. No pertinent family history.   Review of Systems  Constitutional: Negative.   HENT: Negative.  Negative for congestion, nosebleeds and sore throat.   Eyes: Negative.  Negative for blurred vision, double vision and pain.  Respiratory: Negative.  Negative for cough and shortness of breath.   Cardiovascular: Negative.  Negative for chest pain and palpitations.  Gastrointestinal: Negative.  Negative for abdominal pain, diarrhea, nausea and vomiting.  Genitourinary: Negative.  Negative for dysuria and hematuria.  Skin: Negative.  Negative for rash.  Neurological: Positive for dizziness. Negative for  sensory change, focal weakness, weakness and headaches.  Endo/Heme/Allergies: Negative.   All other systems reviewed and are negative.  Today's Vitals   10/08/19 0838  BP: 115/72  Pulse: 64  Temp: (!) 97.1 F (36.2 C)  TempSrc: Temporal  SpO2: 97%  Weight: 206 lb 6.4 oz (93.6 kg)  Height: 5\' 10"  (1.778 m)   Body mass index is 29.62 kg/m.   Physical Exam Vitals reviewed.  Constitutional:      Appearance: Normal appearance.  HENT:     Head: Normocephalic.     Right Ear: Tympanic membrane, ear canal and external ear normal.     Left Ear: Tympanic membrane, ear canal and external ear normal.     Mouth/Throat:     Mouth: Mucous membranes are moist.     Pharynx: Oropharynx is clear.  Eyes:     General: Lids are normal.     Extraocular Movements: Extraocular movements intact.     Conjunctiva/sclera: Conjunctivae normal.     Pupils: Pupils are equal, round, and reactive to light.     Funduscopic exam:    Right eye: No hemorrhage, exudate or papilledema.        Left eye: No hemorrhage, exudate or papilledema.     Visual Fields: Right eye visual fields normal and left eye visual fields normal.  Neck:     Vascular: No carotid bruit.     Comments: Left sided old surgical scar Cardiovascular:     Rate and Rhythm: Normal rate and regular rhythm.     Pulses: Normal pulses.     Heart sounds: Normal heart sounds.  Pulmonary:     Effort: Pulmonary effort is normal.     Breath sounds: Normal breath sounds.  Musculoskeletal:        General: Normal range of motion.     Cervical back: Normal range of motion and neck supple.  Lymphadenopathy:     Cervical: No cervical adenopathy.  Skin:    General: Skin is warm and dry.     Capillary Refill: Capillary refill takes less than 2 seconds.  Neurological:     General: No focal deficit present.     Mental Status: He is alert and oriented to person, place, and time.     Cranial Nerves: No cranial nerve deficit.     Sensory: No sensory  deficit.     Motor: No weakness.     Coordination: Coordination normal.     Gait: Gait normal.     Deep Tendon Reflexes: Reflexes normal.  Psychiatric:        Mood and Affect: Mood normal.        Behavior: Behavior normal.    A total of 30 minutes was spent with the patient, greater than 50% of which was in counseling/coordination of care  regarding differential diagnosis of dizziness, review of most recent office visit notes, review of most recent blood work results, review of lifestyle, diet and nutrition, importance of hydration, importance of quitting smoking and cardiovascular risks associated with smoking as well as different cancers, prognosis and need for follow-up if no better or worse during the next several weeks..   ASSESSMENT & PLAN: Saurabh was seen today for dizziness and check eyes and ears.  Diagnoses and all orders for this visit:  Dizziness  Current smoker    Patient Instructions       If you have lab work done today you will be contacted with your lab results within the next 2 weeks.  If you have not heard from [place] then please contact us. The fastest way to get your results is to register for My Chart.   IF you received an x-ray today, you will receive an invoice from [facility]. Please contact [facility] at [phone] with questions or concerns regarding your invoice.   IF you received labwork today, you will receive an invoice from [facility]. Please contact [facility] at [phone] with questions or concerns regarding your invoice.   Our billing staff will not be able to assist you with questions regarding bills from these companies.  You will be contacted with the lab results as soon as they are available. The fastest way to get your results is to activate your My Chart account. Instructions are located on the last page of this paperwork. If you have not heard from [place] regarding the results in 2 weeks, please contact this office.       Dizziness Dizziness is a common problem. It makes you feel unsteady or light-headed. You may feel like you are about to pass out (faint). Dizziness can lead to getting hurt if you stumble or fall. Dizziness can be caused by many things, including:  Medicines.  Not having enough water in your body (dehydration).  Illness. Follow these instructions at home: Eating and drinking   Drink enough fluid to keep your pee (urine) clear or pale yellow. This helps to keep you from getting dehydrated. Try to drink more clear fluids, such as water.  Do not drink alcohol.  Limit how much caffeine you drink or eat, if your doctor tells you to do that.  Limit how much salt (sodium) you drink or eat, if your doctor tells you to do that. Activity   Avoid making quick movements. ? When you stand up from sitting in a chair, steady yourself until you feel okay. ? In the morning, first sit up on the side of the bed. When you feel okay, stand slowly while you hold onto something. Do this until you know that your balance is fine.  If you need to stand in one place for a long time, move your legs often. Tighten and relax the muscles in your legs while you are standing.  Do not drive or use heavy machinery if you feel dizzy.  Avoid bending down if you feel dizzy. Place items in your home so you can reach them easily without leaning over. Lifestyle  Do not use any products that contain nicotine or tobacco, such as cigarettes and e-cigarettes. If you need help quitting, ask your doctor.  Try to lower your stress level. You can do this by using methods such as yoga or meditation. Talk with your doctor if you need help. General instructions  Watch your dizziness for any changes.  Take over-the-counter and prescription  medicines only as told by your doctor. Talk with your doctor if you think that you are dizzy because of a medicine that you are taking.  Tell a friend or a family member that you are  feeling dizzy. If he or she notices any changes in your behavior, have this person call your doctor.  Keep all follow-up visits as told by your doctor. This is important. Contact a doctor if:  Your dizziness does not go away.  Your dizziness or light-headedness gets worse.  You feel sick to your stomach (nauseous).  You have trouble hearing.  You have new symptoms.  You are unsteady on your feet.  You feel like the room is spinning. Get help right away if:  You throw up (vomit) or have watery poop (diarrhea), and you cannot eat or drink anything.  You have trouble: ? Talking. ? Walking. ? Swallowing. ? Using your arms, hands, or legs.  You feel generally weak.  You are not thinking clearly, or you have trouble forming sentences. A friend or family member may notice this.  You have: ? Chest pain. ? Pain in your belly (abdomen). ? Shortness of breath. ? Sweating.  Your vision changes.  You are bleeding.  You have a very bad headache.  You have neck pain or a stiff neck.  You have a fever. These symptoms may be an emergency. Do not wait to see if the symptoms will go away. Get medical help right away. Call your local emergency services (911 in the [place] Do not drive yourself to the hospital. Summary  Dizziness makes you feel unsteady or light-headed. You may feel like you are about to pass out (faint).  Drink enough fluid to keep your pee (urine) clear or pale yellow. Do not drink alcohol.  Avoid making quick movements if you feel dizzy.  Watch your dizziness for any changes. This information is not intended to replace advice given to you by your health care provider. Make sure you discuss any questions you have with your health care provider. Document Revised: 05/18/2017 Document Reviewed: 06/01/2016 Elsevier Patient Education  2020 Elsevier Inc.      Edwina Barth, MD [facility]

## 2019-10-08 NOTE — Patient Instructions (Addendum)
   If you have lab work done today you will be contacted with your lab results within the next 2 weeks.  If you have not heard from us then please contact us. The fastest way to get your results is to register for My Chart.   IF you received an x-ray today, you will receive an invoice from [facility]. Please contact  Radiology at [phone] with questions or concerns regarding your invoice.   IF you received labwork today, you will receive an invoice from [facility]. Please contact [facility] at [phone] with questions or concerns regarding your invoice.   Our billing staff will not be able to assist you with questions regarding bills from these companies.  You will be contacted with the lab results as soon as they are available. The fastest way to get your results is to activate your My Chart account. Instructions are located on the last page of this paperwork. If you have not heard from us regarding the results in 2 weeks, please contact this office.     Dizziness Dizziness is a common problem. It makes you feel unsteady or light-headed. You may feel like you are about to pass out (faint). Dizziness can lead to getting hurt if you stumble or fall. Dizziness can be caused by many things, including:  Medicines.  Not having enough water in your body (dehydration).  Illness. Follow these instructions at home: Eating and drinking   Drink enough fluid to keep your pee (urine) clear or pale yellow. This helps to keep you from getting dehydrated. Try to drink more clear fluids, such as water.  Do not drink alcohol.  Limit how much caffeine you drink or eat, if your doctor tells you to do that.  Limit how much salt (sodium) you drink or eat, if your doctor tells you to do that. Activity   Avoid making quick movements. ? When you stand up from sitting in a chair, steady yourself until you feel okay. ? In the morning, first sit up on the side of the bed. When  you feel okay, stand slowly while you hold onto something. Do this until you know that your balance is fine.  If you need to stand in one place for a long time, move your legs often. Tighten and relax the muscles in your legs while you are standing.  Do not drive or use heavy machinery if you feel dizzy.  Avoid bending down if you feel dizzy. Place items in your home so you can reach them easily without leaning over. Lifestyle  Do not use any products that contain nicotine or tobacco, such as cigarettes and e-cigarettes. If you need help quitting, ask your doctor.  Try to lower your stress level. You can do this by using methods such as yoga or meditation. Talk with your doctor if you need help. General instructions  Watch your dizziness for any changes.  Take over-the-counter and prescription medicines only as told by your doctor. Talk with your doctor if you think that you are dizzy because of a medicine that you are taking.  Tell a friend or a family member that you are feeling dizzy. If he or she notices any changes in your behavior, have this person call your doctor.  Keep all follow-up visits as told by your doctor. This is important. Contact a doctor if:  Your dizziness does not go away.  Your dizziness or light-headedness gets worse.  You feel sick to your stomach (nauseous).  You   have trouble hearing.  You have new symptoms.  You are unsteady on your feet.  You feel like the room is spinning. Get help right away if:  You throw up (vomit) or have watery poop (diarrhea), and you cannot eat or drink anything.  You have trouble: ? Talking. ? Walking. ? Swallowing. ? Using your arms, hands, or legs.  You feel generally weak.  You are not thinking clearly, or you have trouble forming sentences. A friend or family member may notice this.  You have: ? Chest pain. ? Pain in your belly (abdomen). ? Shortness of breath. ? Sweating.  Your vision changes.  You  are bleeding.  You have a very bad headache.  You have neck pain or a stiff neck.  You have a fever. These symptoms may be an emergency. Do not wait to see if the symptoms will go away. Get medical help right away. Call your local emergency services (911 in the [place] Do not drive yourself to the hospital. Summary  Dizziness makes you feel unsteady or light-headed. You may feel like you are about to pass out (faint).  Drink enough fluid to keep your pee (urine) clear or pale yellow. Do not drink alcohol.  Avoid making quick movements if you feel dizzy.  Watch your dizziness for any changes. This information is not intended to replace advice given to you by your health care provider. Make sure you discuss any questions you have with your health care provider. Document Revised: 05/18/2017 Document Reviewed: 06/01/2016 Elsevier Patient Education  2020 Elsevier Inc.  

## 2019-10-29 ENCOUNTER — Ambulatory Visit: Payer: BC Managed Care – PPO | Admitting: Registered Nurse

## 2019-10-30 ENCOUNTER — Encounter: Payer: Self-pay | Admitting: Registered Nurse

## 2019-10-30 ENCOUNTER — Encounter: Payer: Self-pay | Admitting: Family Medicine

## 2019-10-30 ENCOUNTER — Other Ambulatory Visit: Payer: Self-pay

## 2019-10-30 ENCOUNTER — Ambulatory Visit (INDEPENDENT_AMBULATORY_CARE_PROVIDER_SITE_OTHER): Payer: BC Managed Care – PPO | Admitting: Family Medicine

## 2019-10-30 VITALS — BP 125/83 | HR 77 | Temp 97.9°F | Ht 70.0 in | Wt 210.2 lb

## 2019-10-30 DIAGNOSIS — H6993 Unspecified Eustachian tube disorder, bilateral: Secondary | ICD-10-CM

## 2019-10-30 DIAGNOSIS — H9193 Unspecified hearing loss, bilateral: Secondary | ICD-10-CM | POA: Diagnosis not present

## 2019-10-30 DIAGNOSIS — H6983 Other specified disorders of Eustachian tube, bilateral: Secondary | ICD-10-CM

## 2019-10-30 DIAGNOSIS — R42 Dizziness and giddiness: Secondary | ICD-10-CM | POA: Diagnosis not present

## 2019-10-30 MED ORDER — PREDNISONE 20 MG PO TABS
ORAL_TABLET | ORAL | 0 refills | Status: DC
Start: 2019-10-30 — End: 2020-03-18

## 2019-10-30 MED ORDER — FLUTICASONE PROPIONATE 50 MCG/ACT NA SUSP: 2.0000 | Freq: Every day | NASAL | 6 refills | Status: DC

## 2019-10-30 MED ORDER — MECLIZINE HCL 12.5 MG PO TABS: 12.5000 mg | ORAL_TABLET | Freq: Three times a day (TID) | ORAL | 0 refills | Status: DC | PRN

## 2019-10-30 NOTE — Patient Instructions (Addendum)
  Referral is being made to the ear nose and throat doctor for evaluation of your hearing and dizziness.  If you do not hear from our referrals desk in the next 6 or 7 days call back out here and ask what became of your referral.  I have prescribed something called meclizine that you can take 1 or 2 pills 3 times daily if needed for dizziness  I have prescribed prednisone which is a cortisone medication to try and help open up the eustachian tubes.  Take 3 pills daily for 2 days, then 2 daily for 2 days, then 1 daily for 2 days.  Take all of the pills simultaneously, not spread out through the day.  Best taken in the morning with food, but I would take your first 3 pills today after eating something.  Use the fluticasone nose spray 2 sprays each nostril at bedtime  Return if worse or go to an emergency room or urgent care if necessary.   If you have lab work done today you will be contacted with your lab results within the next 2 weeks.  If you have not heard from [place] then please contact us. The fastest way to get your results is to register for My Chart.   IF you received an x-ray today, you will receive an invoice from [facility]. Please contact [facility] at [phone] with questions or concerns regarding your invoice.   IF you received labwork today, you will receive an invoice from [place]. Please contact [facility] at [phone] with questions or concerns regarding your invoice.   Our billing staff will not be able to assist you with questions regarding bills from these companies.  You will be contacted with the lab results as soon as they are available. The fastest way to get your results is to activate your My Chart account. Instructions are located on the last page of this paperwork. If you have not heard from [place] regarding the results in 2 weeks, please contact this office.

## 2019-10-30 NOTE — Progress Notes (Signed)
Patient ID: Matthew Weeks, male    DOB: 10-11-1970  Age: 49 y.o. MRN: [id number]  Chief Complaint  Patient presents with  . Otalgia    Pt stated that he has been having some Rt ear pain for the last month. He stated that he can not hardly hear out of it and when he moves too fast/too much he will get dizzy    Subjective:  Patient is here for for further problems with his dizziness and hearing.  He is not actually having ear pain.  He has had the sense of decreased hearing.  This is been fairly acute.  He has occasional headache but that is relieved by Tylenol.  He has not had any major staggering but he feels unsteady from the dizziness.  Recently done labs all look pretty good  Current allergies, medications, problem list, past/family and social histories reviewed.  Objective:  BP 125/83 (BP Location: Right Arm, Patient Position: Sitting, Cuff Size: Normal)   Pulse 77   Temp 97.9 F (36.6 C) (Temporal)   Ht 5\' 10"  (1.778 m)   Wt 210 lb 3.2 oz (95.3 kg)   SpO2 96%   BMI 30.16 kg/m   No acute distress.  Alert and oriented.  Vital signs were stable with blood pressure 125/83.  His TMs appear fairly normal, maybe a little retracted on the left.  Can only see the superior portion of the right drum.  No carotid bruits.  Large scar in his left neck from some surgery had followed up with chemotherapy many years ago.  I wonder if it was some kind of a lymphatic tumor but he did not think it was a cancer.  Assessment & Plan:   Assessment: 1. Decreased hearing of both ears   2. Dysfunction of both eustachian tubes   3. Dizziness       Plan: See instructions  Orders Placed This Encounter  Procedures  . Ambulatory referral to ENT    Referral Priority:   Routine    Referral Type:   Consultation    Referral Reason:   Specialty Services Required    Requested Specialty:   Otolaryngology    Number of Visits Requested:   1    Meds ordered this encounter  Medications  . meclizine  (ANTIVERT) 12.5 MG tablet    Sig: Take 1 tablet (12.5 mg total) by mouth 3 (three) times daily as needed for dizziness.    Dispense:  30 tablet    Refill:  0  . predniSONE (DELTASONE) 20 MG tablet    Sig: Take 3 each morning for 2 days, then 2 for 2 days, then 1 for 2 days for eustachian tubes    Dispense:  12 tablet    Refill:  0  . fluticasone (FLONASE) 50 MCG/ACT nasal spray    Sig: Place 2 sprays into both nostrils daily.    Dispense:  16 g    Refill:  6         Patient Instructions    Referral is being made to the ear nose and throat doctor for evaluation of your hearing and dizziness.  If you do not hear from our referrals desk in the next 6 or 7 days call back out here and ask what became of your referral.  I have prescribed something called meclizine that you can take 1 or 2 pills 3 times daily if needed for dizziness  I have prescribed prednisone which is a cortisone medication to try and help  open up the eustachian tubes.  Take 3 pills daily for 2 days, then 2 daily for 2 days, then 1 daily for 2 days.  Take all of the pills simultaneously, not spread out through the day.  Best taken in the morning with food, but I would take your first 3 pills today after eating something.  Use the fluticasone nose spray 2 sprays each nostril at bedtime  Return if worse or go to an emergency room or urgent care if necessary.   If you have lab work done today you will be contacted with your lab results within the next 2 weeks.  If you have not heard from [place] then please contact us. The fastest way to get your results is to register for My Chart.   IF you received an x-ray today, you will receive an invoice from [facility]. Please contact [facility] at [phone] with questions or concerns regarding your invoice.   IF you received labwork today, you will receive an invoice from [facility]. Please contact [facility] at [phone] with questions or concerns regarding  your invoice.   Our billing staff will not be able to assist you with questions regarding bills from these companies.  You will be contacted with the lab results as soon as they are available. The fastest way to get your results is to activate your My Chart account. Instructions are located on the last page of this paperwork. If you have not heard from [place] regarding the results in 2 weeks, please contact this office.        No follow-ups on file.   Ruben Reason, MD 10/30/2019

## 2019-11-19 DIAGNOSIS — Z0000 Encounter for general adult medical examination without abnormal findings: Secondary | ICD-10-CM | POA: Diagnosis not present

## 2019-11-19 DIAGNOSIS — Z13220 Encounter for screening for lipoid disorders: Secondary | ICD-10-CM | POA: Diagnosis not present

## 2019-11-28 ENCOUNTER — Telehealth: Payer: Self-pay | Admitting: Family Medicine

## 2019-11-28 ENCOUNTER — Other Ambulatory Visit: Payer: Self-pay | Admitting: Family Medicine

## 2019-11-28 DIAGNOSIS — R42 Dizziness and giddiness: Secondary | ICD-10-CM

## 2019-11-28 MED ORDER — MECLIZINE HCL 12.5 MG PO TABS: 12.5000 mg | ORAL_TABLET | Freq: Three times a day (TID) | ORAL | 0 refills | Status: DC | PRN

## 2019-11-28 NOTE — Telephone Encounter (Signed)
Rx has been sent in for  90 day refill until he is seen again.

## 2019-11-28 NOTE — Telephone Encounter (Signed)
Pt came in asking  For refills  on meclizine (ANTIVERT) 12.5 MG tablet [[id number]]    [id number] - [address]  [address], [place] [address]  Phone:  [phone] Fax:  [phone]  DEA #:  [id number]  Patient is wanting called in because he needs to go to work at 2:00

## 2019-12-16 ENCOUNTER — Encounter: Payer: Self-pay | Admitting: Emergency Medicine

## 2019-12-16 ENCOUNTER — Telehealth: Payer: Self-pay | Admitting: Emergency Medicine

## 2019-12-16 NOTE — Telephone Encounter (Signed)
Sent pt letter regarding  Returning their cologuard kit.

## 2019-12-16 NOTE — Progress Notes (Signed)
Sent pt letter regarding returning their cologuard kit. 

## 2020-02-06 DIAGNOSIS — Z20822 Contact with and (suspected) exposure to covid-19: Secondary | ICD-10-CM | POA: Diagnosis not present

## 2020-03-03 ENCOUNTER — Ambulatory Visit: Payer: BC Managed Care – PPO | Admitting: Emergency Medicine

## 2020-03-04 ENCOUNTER — Encounter: Payer: Self-pay | Admitting: Emergency Medicine

## 2020-03-18 ENCOUNTER — Ambulatory Visit (INDEPENDENT_AMBULATORY_CARE_PROVIDER_SITE_OTHER): Payer: BC Managed Care – PPO

## 2020-03-18 ENCOUNTER — Encounter: Payer: Self-pay | Admitting: Emergency Medicine

## 2020-03-18 ENCOUNTER — Ambulatory Visit (INDEPENDENT_AMBULATORY_CARE_PROVIDER_SITE_OTHER): Payer: BC Managed Care – PPO | Admitting: Emergency Medicine

## 2020-03-18 ENCOUNTER — Other Ambulatory Visit: Payer: Self-pay

## 2020-03-18 VITALS — BP 121/79 | HR 73 | Temp 97.6°F | Resp 16 | Ht 70.0 in | Wt 218.0 lb

## 2020-03-18 DIAGNOSIS — M5134 Other intervertebral disc degeneration, thoracic region: Secondary | ICD-10-CM | POA: Diagnosis not present

## 2020-03-18 DIAGNOSIS — M549 Dorsalgia, unspecified: Secondary | ICD-10-CM

## 2020-03-18 DIAGNOSIS — M545 Low back pain, unspecified: Secondary | ICD-10-CM | POA: Diagnosis not present

## 2020-03-18 MED ORDER — MELOXICAM 15 MG PO TABS: 15.0000 mg | ORAL_TABLET | Freq: Every day | ORAL | 0 refills | Status: DC

## 2020-03-18 MED ORDER — CYCLOBENZAPRINE HCL 10 MG PO TABS: 10.0000 mg | ORAL_TABLET | Freq: Every day | ORAL | 0 refills | Status: AC

## 2020-03-18 NOTE — Progress Notes (Signed)
Matthew Weeks 49 y.o.   Chief Complaint  Patient presents with  . Back Pain    per patient it started 3-4 days lower right area    HISTORY OF PRESENT ILLNESS: This is a 49 y.o. male complaining of right-sided mid to low back pain that started 4 days ago.  Denies injury. No other associated symptoms.  Constant sharp pain without radiation, worse with movement. No other complaints or medical concerns today.  HPI   Prior to Admission medications   Medication Sig Start Date End Date Taking? Authorizing Provider  clotrimazole-betamethasone (LOTRISONE) cream Apply 1 application topically 2 (two) times daily. Patient not taking: Reported on 03/18/2020 01/14/18   Georgina QuintSagardia, Fredna Stricker Jose, MD  fluticasone [facility]FLONASE) 50 MCG/ACT nasal spray Place 2 sprays into both nostrils daily. Patient not taking: Reported on 03/18/2020 10/30/19   [name], [name], MD  ibuprofen (ADVIL,MOTRIN) 800 MG tablet Take 1 tablet (800 mg total) by mouth every 8 (eight) hours as needed. Patient not taking: Reported on 03/18/2020 08/15/18   Tilden Fossaees, Elizabeth, MD  meclizine (ANTIVERT) 12.5 MG tablet Take 1 tablet (12.5 mg total) by mouth 3 (three) times daily as needed for dizziness. Patient not taking: Reported on 03/18/2020 11/28/19   Georgina QuintSagardia, Supreme Rybarczyk Jose, MD  meloxicam (MOBIC) 7.5 MG tablet Take 1 tablet (7.5 mg total) by mouth daily. Patient not taking: Reported on 03/18/2020 01/16/19   Georgina QuintSagardia, Keshona Kartes Jose, MD  methocarbamol (ROBAXIN) 500 MG tablet Take 1 tablet (500 mg total) by mouth 2 (two) times daily. Patient not taking: Reported on 03/18/2020 12/03/14   [name], [name], [name]  predniSONE (DELTASONE) 20 MG tablet Take 3 each morning for 2 days, then 2 for 2 days, then 1 for 2 days for eustachian tubes Patient not taking: Reported on 03/18/2020 10/30/19   [name], [name], MD    No Known Allergies  There are no problems to display for this patient.   History reviewed. No pertinent past medical history.  History  reviewed. No pertinent surgical history.  Social History   Socioeconomic History  . Marital status: Married    Spouse name: Not on file  . Number of children: Not on file  . Years of education: Not on file  . Highest education level: Not on file  Occupational History  . Not on file  Tobacco Use  . Smoking status: Current Every Day Smoker    Packs/day: 1.00    Years: 20.00    Pack years: 20.00    Types: Cigarettes  . Smokeless tobacco: Never Used  Substance and Sexual Activity  . Alcohol use: No    Alcohol/week: 0.0 standard drinks  . Drug use: Never  . Sexual activity: Not on file  Other Topics Concern  . Not on file  Social History Narrative  . Not on file   Social Determinants of Health   Financial Resource Strain:   . Difficulty of Paying Living Expenses: Not on file  Food Insecurity:   . Worried About Programme researcher, broadcasting/film/videounning Out of Food in the Last Year: Not on file  . Ran Out of Food in the Last Year: Not on file  Transportation Needs:   . Lack of Transportation (Medical): Not on file  . Lack of Transportation (Non-Medical): Not on file  Physical Activity:   . Days of Exercise per Week: Not on file  . Minutes of Exercise per Session: Not on file  Stress:   . Feeling of Stress : Not on file  Social Connections:   .  Frequency of Communication with Friends and Family: Not on file  . Frequency of Social Gatherings with Friends and Family: Not on file  . Attends Religious Services: Not on file  . Active Member of Clubs or Organizations: Not on file  . Attends Banker Meetings: Not on file  . Marital Status: Not on file  Intimate Partner Violence:   . Fear of Current or Ex-Partner: Not on file  . Emotionally Abused: Not on file  . Physically Abused: Not on file  . Sexually Abused: Not on file    History reviewed. No pertinent family history.   Review of Systems  Constitutional: Negative.  Negative for chills and fever.  HENT: Negative.  Negative for  congestion and sore throat.   Respiratory: Negative.  Negative for cough and shortness of breath.   Cardiovascular: Negative.  Negative for chest pain and palpitations.  Gastrointestinal: Negative.  Negative for abdominal pain, diarrhea, nausea and vomiting.  Genitourinary: Negative.  Negative for dysuria and hematuria.  Musculoskeletal: Positive for back pain.  Skin: Negative.  Negative for rash.  Neurological: Negative.  Negative for dizziness and headaches.  All other systems reviewed and are negative.  Today's Vitals   03/18/20 1138  BP: 121/79  Pulse: 73  Resp: 16  Temp: 97.6 F (36.4 C)  TempSrc: Temporal  SpO2: 97%  Weight: 218 lb (98.9 kg)  Height:  (1.778 m)   Body mass index is 31.28 kg/m.   Physical Exam Vitals reviewed.  Constitutional:      Appearance: Normal appearance.  HENT:     Head: Normocephalic.  Eyes:     Extraocular Movements: Extraocular movements intact.     Conjunctiva/sclera: Conjunctivae normal.     Pupils: Pupils are equal, round, and reactive to light.  Cardiovascular:     Rate and Rhythm: Normal rate and regular rhythm.     Pulses: Normal pulses.     Heart sounds: Normal heart sounds.  Pulmonary:     Effort: Pulmonary effort is normal.     Breath sounds: Normal breath sounds.  Abdominal:     General: Bowel sounds are normal. There is no distension.     Palpations: Abdomen is soft.     Tenderness: There is no abdominal tenderness.  Musculoskeletal:        General: Normal range of motion.     Cervical back: Normal, normal range of motion and neck supple.     Thoracic back: Spasms and tenderness present. No bony tenderness.     Lumbar back: Spasms and tenderness present. No bony tenderness.  Skin:    General: Skin is warm and dry.  Neurological:     General: No focal deficit present.     Mental Status: He is alert and oriented to person, place, and time.  Psychiatric:        Mood and Affect: Mood normal.        Behavior:  Behavior normal.    DG Thoracic Spine 2 View  Result Date: 03/18/2020 CLINICAL DATA:  Dorsalgia EXAM: THORACIC SPINE 3 VIEWS COMPARISON:  None. FINDINGS: Frontal, lateral, and swimmer's views were obtained. There is slight lower thoracic levoscoliosis. There is no fracture or spondylolisthesis. There is mild to moderate disc space narrowing at several levels in the midthoracic region. No erosive change or paraspinous lesions. Visualized lungs clear. 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 No fracture or spondylolisthesis. Mild scoliosis. Electronically Signed   By: Chrissie Noa  Margarita Grizzle III M.D.   On: 03/18/2020 12:21   DG Lumbar Spine 2-3 Views  Result Date: 03/18/2020 CLINICAL DATA:  Low back pain EXAM: LUMBAR SPINE - 2-3 VIEW COMPARISON:  December 03, 2014 FINDINGS: Frontal, lateral, and spot lumbosacral lateral images were obtained. There are 5 non-rib-bearing lumbar type vertebral bodies. There is stable mid lumbar levoscoliosis. There is no fracture or spondylolisthesis. The disc spaces appear unremarkable. No erosive change. IMPRESSION: Stable levoscoliosis. No fracture or spondylolisthesis. No appreciable disc space narrowing. Electronically Signed   By: Bretta Bang III M.D.   On: 03/18/2020 12:22     ASSESSMENT & PLAN: Matthew Weeks was seen today for back pain.  Diagnoses and all orders for this visit:  Musculoskeletal back pain -     DG Thoracic Spine 2 View -     DG Lumbar Spine 2-3 Views -     meloxicam (MOBIC) 15 MG tablet; Take 1 tablet (15 mg total) by mouth daily. -     cyclobenzaprine (FLEXERIL) 10 MG tablet; Take 1 tablet (10 mg total) by mouth at bedtime for 10 days.  Back pain, unspecified back location, unspecified back pain laterality, unspecified chronicity -     Cancel: POCT urinalysis dipstick    Patient Instructions       If you have lab work done today you will be contacted with your lab results  within the next 2 weeks.  If you have not heard from [place] then please contact us. The fastest way to get your results is to register for My Chart.   IF you received an x-ray today, you will receive an invoice from [facility]. Please contact [facility] at [phone] with questions or concerns regarding your invoice.   IF you received labwork today, you will receive an invoice from [facility]. Please contact [facility] at [phone] with questions or concerns regarding your invoice.   Our billing staff will not be able to assist you with questions regarding bills from these companies.  You will be contacted with the lab results as soon as they are available. The fastest way to get your results is to activate your My Chart account. Instructions are located on the last page of this paperwork. If you have not heard from [place] regarding the results in 2 weeks, please contact this office.     Acute Back Pain, Adult Acute back pain is sudden and usually short-lived. It is often caused by an injury to the muscles and tissues in the back. The injury may result from:  A muscle or ligament getting overstretched or torn (strained). Ligaments are tissues that connect bones to each other. Lifting something improperly can cause a back strain.  Wear and tear (degeneration) of the spinal disks. Spinal disks are circular tissue that provides cushioning between the bones of the spine (vertebrae).  Twisting motions, such as while playing sports or doing yard work.  A hit to the back.  Arthritis. You may have a physical exam, lab tests, and imaging tests to find the cause of your pain. Acute back pain usually goes away with rest and home care. Follow these instructions at home: Managing pain, stiffness, and swelling  Take over-the-counter and prescription medicines only as told by your health care provider.  Your health care provider may recommend applying ice during the first 24-48 hours  after your pain starts. To do this: ? Put ice in a plastic bag. ? Place a towel between your skin and the bag. ? Leave the ice  on for 20 minutes, 2-3 times a day.  If directed, apply heat to the affected area as often as told by your health care provider. Use the heat source that your health care provider recommends, such as a moist heat pack or a heating pad. ? Place a towel between your skin and the heat source. ? Leave the heat on for 20-30 minutes. ? Remove the heat if your skin turns bright red. This is especially important if you are unable to feel pain, heat, or cold. You have a greater risk of getting burned. Activity   Do not stay in bed. Staying in bed for more than 1-2 days can delay your recovery.  Sit up and stand up straight. Avoid leaning forward when you sit, or hunching over when you stand. ? If you work at a desk, sit close to it so you do not need to lean over. Keep your chin tucked in. Keep your neck drawn back, and keep your elbows bent at a right angle. Your arms should look like the letter "L." ? Sit high and close to the steering wheel when you drive. Add lower back (lumbar) support to your car seat, if needed.  Take short walks on even surfaces as soon as you are able. Try to increase the length of time you walk each day.  Do not sit, drive, or stand in one place for more than 30 minutes at a time. Sitting or standing for long periods of time can put stress on your back.  Do not drive or use heavy machinery while taking prescription pain medicine.  Use proper lifting techniques. When you bend and lift, use positions that put less stress on your back: ? [place] your knees. ? Keep the load close to your body. ? Avoid twisting.  Exercise regularly as told by your health care provider. Exercising helps your back heal faster and helps prevent back injuries by keeping muscles strong and flexible.  Work with a physical therapist to make a safe exercise program, as  recommended by your health care provider. Do any exercises as told by your physical therapist. Lifestyle  Maintain a healthy weight. Extra weight puts stress on your back and makes it difficult to have good posture.  Avoid activities or situations that make you feel anxious or stressed. Stress and anxiety increase muscle tension and can make back pain worse. Learn ways to manage anxiety and stress, such as through exercise. General instructions  Sleep on a firm mattress in a comfortable position. Try lying on your side with your knees slightly bent. If you lie on your back, put a pillow under your knees.  Follow your treatment plan as told by your health care provider. This may include: ? Cognitive or behavioral therapy. ? Acupuncture or massage therapy. ? Meditation or yoga. Contact a health care provider if:  You have pain that is not relieved with rest or medicine.  You have increasing pain going down into your legs or buttocks.  Your pain does not improve after 2 weeks.  You have pain at night.  You lose weight without trying.  You have a fever or chills. Get help right away if:  You develop new bowel or bladder control problems.  You have unusual weakness or numbness in your arms or legs.  You develop nausea or vomiting.  You develop abdominal pain.  You feel faint. Summary  Acute back pain is sudden and usually short-lived.  Use proper lifting techniques. When you bend  and lift, use positions that put less stress on your back.  Take over-the-counter and prescription medicines and apply heat or ice as directed by your health care provider. This information is not intended to replace advice given to you by your health care provider. Make sure you discuss any questions you have with your health care provider. Document Revised: 09/03/2018 Document Reviewed: 12/27/2016 Elsevier Patient Education  2020 Elsevier Inc.      Edwina Barth, MD [facility]

## 2020-03-18 NOTE — Patient Instructions (Addendum)
   If you have lab work done today you will be contacted with your lab results within the next 2 weeks.  If you have not heard from us then please contact us. The fastest way to get your results is to register for My Chart.   IF you received an x-ray today, you will receive an invoice from [facility]. Please contact [facility] at [phone] with questions or concerns regarding your invoice.   IF you received labwork today, you will receive an invoice from [facility]. Please contact [facility] at [phone] with questions or concerns regarding your invoice.   Our billing staff will not be able to assist you with questions regarding bills from these companies.  You will be contacted with the lab results as soon as they are available. The fastest way to get your results is to activate your My Chart account. Instructions are located on the last page of this paperwork. If you have not heard from us regarding the results in 2 weeks, please contact this office.     Acute Back Pain, Adult Acute back pain is sudden and usually short-lived. It is often caused by an injury to the muscles and tissues in the back. The injury may result from:  A muscle or ligament getting overstretched or torn (strained). Ligaments are tissues that connect bones to each other. Lifting something improperly can cause a back strain.  Wear and tear (degeneration) of the spinal disks. Spinal disks are circular tissue that provides cushioning between the bones of the spine (vertebrae).  Twisting motions, such as while playing sports or doing yard work.  A hit to the back.  Arthritis. You may have a physical exam, lab tests, and imaging tests to find the cause of your pain. Acute back pain usually goes away with rest and home care. Follow these instructions at home: Managing pain, stiffness, and swelling  Take over-the-counter and prescription medicines only as told by your health care  provider.  Your health care provider may recommend applying ice during the first 24-48 hours after your pain starts. To do this: ? Put ice in a plastic bag. ? Place a towel between your skin and the bag. ? Leave the ice on for 20 minutes, 2-3 times a day.  If directed, apply heat to the affected area as often as told by your health care provider. Use the heat source that your health care provider recommends, such as a moist heat pack or a heating pad. ? Place a towel between your skin and the heat source. ? Leave the heat on for 20-30 minutes. ? Remove the heat if your skin turns bright red. This is especially important if you are unable to feel pain, heat, or cold. You have a greater risk of getting burned. Activity   Do not stay in bed. Staying in bed for more than 1-2 days can delay your recovery.  Sit up and stand up straight. Avoid leaning forward when you sit, or hunching over when you stand. ? If you work at a desk, sit close to it so you do not need to lean over. Keep your chin tucked in. Keep your neck drawn back, and keep your elbows bent at a right angle. Your arms should look like the letter "L." ? Sit high and close to the steering wheel when you drive. Add lower back (lumbar) support to your car seat, if needed.  Take short walks on even surfaces as soon as you are able. Try   to increase the length of time you walk each day.  Do not sit, drive, or stand in one place for more than 30 minutes at a time. Sitting or standing for long periods of time can put stress on your back.  Do not drive or use heavy machinery while taking prescription pain medicine.  Use proper lifting techniques. When you bend and lift, use positions that put less stress on your back: ? Bend your knees. ? Keep the load close to your body. ? Avoid twisting.  Exercise regularly as told by your health care provider. Exercising helps your back heal faster and helps prevent back injuries by keeping muscles  strong and flexible.  Work with a physical therapist to make a safe exercise program, as recommended by your health care provider. Do any exercises as told by your physical therapist. Lifestyle  Maintain a healthy weight. Extra weight puts stress on your back and makes it difficult to have good posture.  Avoid activities or situations that make you feel anxious or stressed. Stress and anxiety increase muscle tension and can make back pain worse. Learn ways to manage anxiety and stress, such as through exercise. General instructions  Sleep on a firm mattress in a comfortable position. Try lying on your side with your knees slightly bent. If you lie on your back, put a pillow under your knees.  Follow your treatment plan as told by your health care provider. This may include: ? Cognitive or behavioral therapy. ? Acupuncture or massage therapy. ? Meditation or yoga. Contact a health care provider if:  You have pain that is not relieved with rest or medicine.  You have increasing pain going down into your legs or buttocks.  Your pain does not improve after 2 weeks.  You have pain at night.  You lose weight without trying.  You have a fever or chills. Get help right away if:  You develop new bowel or bladder control problems.  You have unusual weakness or numbness in your arms or legs.  You develop nausea or vomiting.  You develop abdominal pain.  You feel faint. Summary  Acute back pain is sudden and usually short-lived.  Use proper lifting techniques. When you bend and lift, use positions that put less stress on your back.  Take over-the-counter and prescription medicines and apply heat or ice as directed by your health care provider. This information is not intended to replace advice given to you by your health care provider. Make sure you discuss any questions you have with your health care provider. Document Revised: 09/03/2018 Document Reviewed: 12/27/2016 Elsevier  Patient Education  2020 Elsevier Inc.  

## 2020-03-19 ENCOUNTER — Ambulatory Visit: Payer: BC Managed Care – PPO | Admitting: Family Medicine

## 2020-04-10 ENCOUNTER — Other Ambulatory Visit: Payer: Self-pay | Admitting: Emergency Medicine

## 2020-04-10 DIAGNOSIS — M549 Dorsalgia, unspecified: Secondary | ICD-10-CM

## 2020-04-29 ENCOUNTER — Ambulatory Visit: Payer: BC Managed Care – PPO | Admitting: Family Medicine

## 2020-05-12 ENCOUNTER — Telehealth: Payer: BC Managed Care – PPO | Admitting: Emergency Medicine

## 2020-05-13 ENCOUNTER — Encounter: Payer: Self-pay | Admitting: Family Medicine

## 2020-05-13 ENCOUNTER — Ambulatory Visit: Payer: BC Managed Care – PPO | Admitting: Family Medicine

## 2020-05-13 ENCOUNTER — Other Ambulatory Visit: Payer: Self-pay

## 2020-05-13 VITALS — BP 124/80 | HR 84 | Temp 97.5°F | Ht 70.0 in | Wt 216.6 lb

## 2020-05-13 DIAGNOSIS — R059 Cough, unspecified: Secondary | ICD-10-CM | POA: Diagnosis not present

## 2020-05-13 DIAGNOSIS — J209 Acute bronchitis, unspecified: Secondary | ICD-10-CM | POA: Diagnosis not present

## 2020-05-13 MED ORDER — BENZONATATE 100 MG PO CAPS: 100.0000 mg | ORAL_CAPSULE | Freq: Three times a day (TID) | ORAL | 0 refills | Status: AC | PRN

## 2020-05-13 MED ORDER — AZITHROMYCIN 500 MG PO TABS: 500.0000 mg | ORAL_TABLET | Freq: Every day | ORAL | 0 refills | Status: AC

## 2020-05-13 NOTE — Progress Notes (Signed)
Patient ID: Matthew Weeks, male    DOB: [birth year]  Age: 49 y.o. MRN: [id number]  Chief Complaint  Patient presents with  . Cough    5x times a day going on 2 weeks    Subjective:  Patient has had a cough for 2 weeks.  It started with a runny nose and cough.  No fever.  Did not feel particularly ill.  He did have some wheezing it sounds like.  He does not smoke.  His wife called in after he had it but she is already well.  He has not had any major breathing problems in the past.  He did do a home Covid test about a week ago and it was negative.  He is not vaccinated, we discussed the need for that.  Current allergies, medications, problem list, past/family and social histories reviewed.  Objective:  BP 124/80   Pulse 84   Temp (!) 97.5 F (36.4 C) (Temporal)   Ht 5\' 10"  (1.778 m)   Wt 216 lb 9.6 oz (98.2 kg)   SpO2 98%   BMI 31.08 kg/m   No acute distress.  Healthy-appearing.  TMs normal.  Throat clear.  Neck supple without nodes.  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  Chest clear to auscultation.  Heart regular without murmur.  Assessment & Plan:   Assessment: 1. Cough   2. Acute bronchitis, unspecified organism       Plan: See instructions.  We will get a chest x-ray and CBC if not improving.  No orders of the defined types were placed in this encounter.   Meds ordered this encounter  Medications  . azithromycin (ZITHROMAX) 500 MG tablet    Sig: Take 1 tablet (500 mg total) by mouth daily.    Dispense:  3 tablet    Refill:  0  . benzonatate (TESSALON) 100 MG capsule    Sig: Take 1-2 capsules (100-200 mg total) by mouth 3 (three) times daily as needed.    Dispense:  30 capsule    Refill:  0         Patient Instructions   Take azithromycin (antibiotic) 250 mg 2 pills today, then 1 daily for 5 days.  Use the benzonatate cough pills 1 or 2 pills every 6 or 8 hours as needed  for cough.  You can continue using your NyQuil also.  Drink lots of water  If you are not improving we may need to do some other tests or x-rays on you, but I do not think they are necessary today.  I strongly urge you to get the Covid vaccination as soon as you are feeling better.  Return as needed.   If you have lab work done today you will be contacted with your lab results within the next 2 weeks.  If you have not heard from [place] then please contact us. The fastest way to get your results is to register for My Chart.   IF you received an x-ray today, you will receive an invoice from [facility]. Please contact [facility] at [phone] with questions or concerns regarding your invoice.   IF you received labwork today, you will receive an invoice from [facility]. Please contact [facility] at [phone] with questions or concerns regarding your invoice.   Our billing staff will not be able to assist you with questions regarding bills from these companies.  You will be contacted with the lab results as soon as they are available. The fastest way to get your results is to activate your My Chart account. Instructions are located on the last page of this paperwork. If you have not heard from [place] regarding the results in 2 weeks, please contact this office.          Return if symptoms worsen or fail to improve.   Janace Hoard, MD 05/13/2020

## 2020-05-13 NOTE — Patient Instructions (Addendum)
Take azithromycin (antibiotic) 250 mg 2 pills today, then 1 daily for 5 days.  Use the benzonatate cough pills 1 or 2 pills every 6 or 8 hours as needed for cough.  You can continue using your NyQuil also.  Drink lots of water  If you are not improving we may need to do some other tests or x-rays on you, but I do not think they are necessary today.  I strongly urge you to get the Covid vaccination as soon as you are feeling better.  Return as needed.   If you have lab work done today you will be contacted with your lab results within the next 2 weeks.  If you have not heard from [place] then please contact us. The fastest way to get your results is to register for My Chart.   IF you received an x-ray today, you will receive an invoice from [facility]. Please contact [facility] at [phone] with questions or concerns regarding your invoice.   IF you received labwork today, you will receive an invoice from [facility]. Please contact [facility] at [phone] with questions or concerns regarding your invoice.   Our billing staff will not be able to assist you with questions regarding bills from these companies.  You will be contacted with the lab results as soon as they are available. The fastest way to get your results is to activate your My Chart account. Instructions are located on the last page of this paperwork. If you have not heard from [place] regarding the results in 2 weeks, please contact this office.

## 2020-06-25 ENCOUNTER — Ambulatory Visit: Payer: BC Managed Care – PPO | Admitting: Family Medicine

## 2020-06-28 ENCOUNTER — Encounter: Payer: Self-pay | Admitting: Family Medicine

## 2020-07-13 ENCOUNTER — Ambulatory Visit: Payer: No Typology Code available for payment source | Attending: Internal Medicine

## 2020-07-13 DIAGNOSIS — Z23 Encounter for immunization: Secondary | ICD-10-CM

## 2020-07-13 NOTE — Progress Notes (Signed)
   Covid-19 Vaccination Clinic  Name:  Matthew Weeks    MRN: [id number] DOB: [birth year]  07/13/2020  [name] was observed post Covid-19 immunization for 15 minutes without incident. He was provided with Vaccine Information Sheet and instruction to access the V-Safe system.   [name] was instructed to call 911 with any severe reactions post vaccine: Marland Kitchen Difficulty breathing  . Swelling of face and throat  . A fast heartbeat  . A bad rash all over body  . Dizziness and weakness   Immunizations Administered    Name Date Dose VIS Date Route   JANSSEN COVID-19 VACCINE 07/13/2020  3:45 PM 0.5 mL 03/17/2020 Intramuscular   Manufacturer: [employer]   Lot: [id number]   NDC: [phone]

## 2021-04-23 IMAGING — DX DG LUMBAR SPINE 2-3V
3 series · 3 of 3 positions shown · non-contrast
Comparison: December 03, 2014

CLINICAL DATA: Low back pain

EXAM:
LUMBAR SPINE - 2-3 VIEW

[l-spine ap]
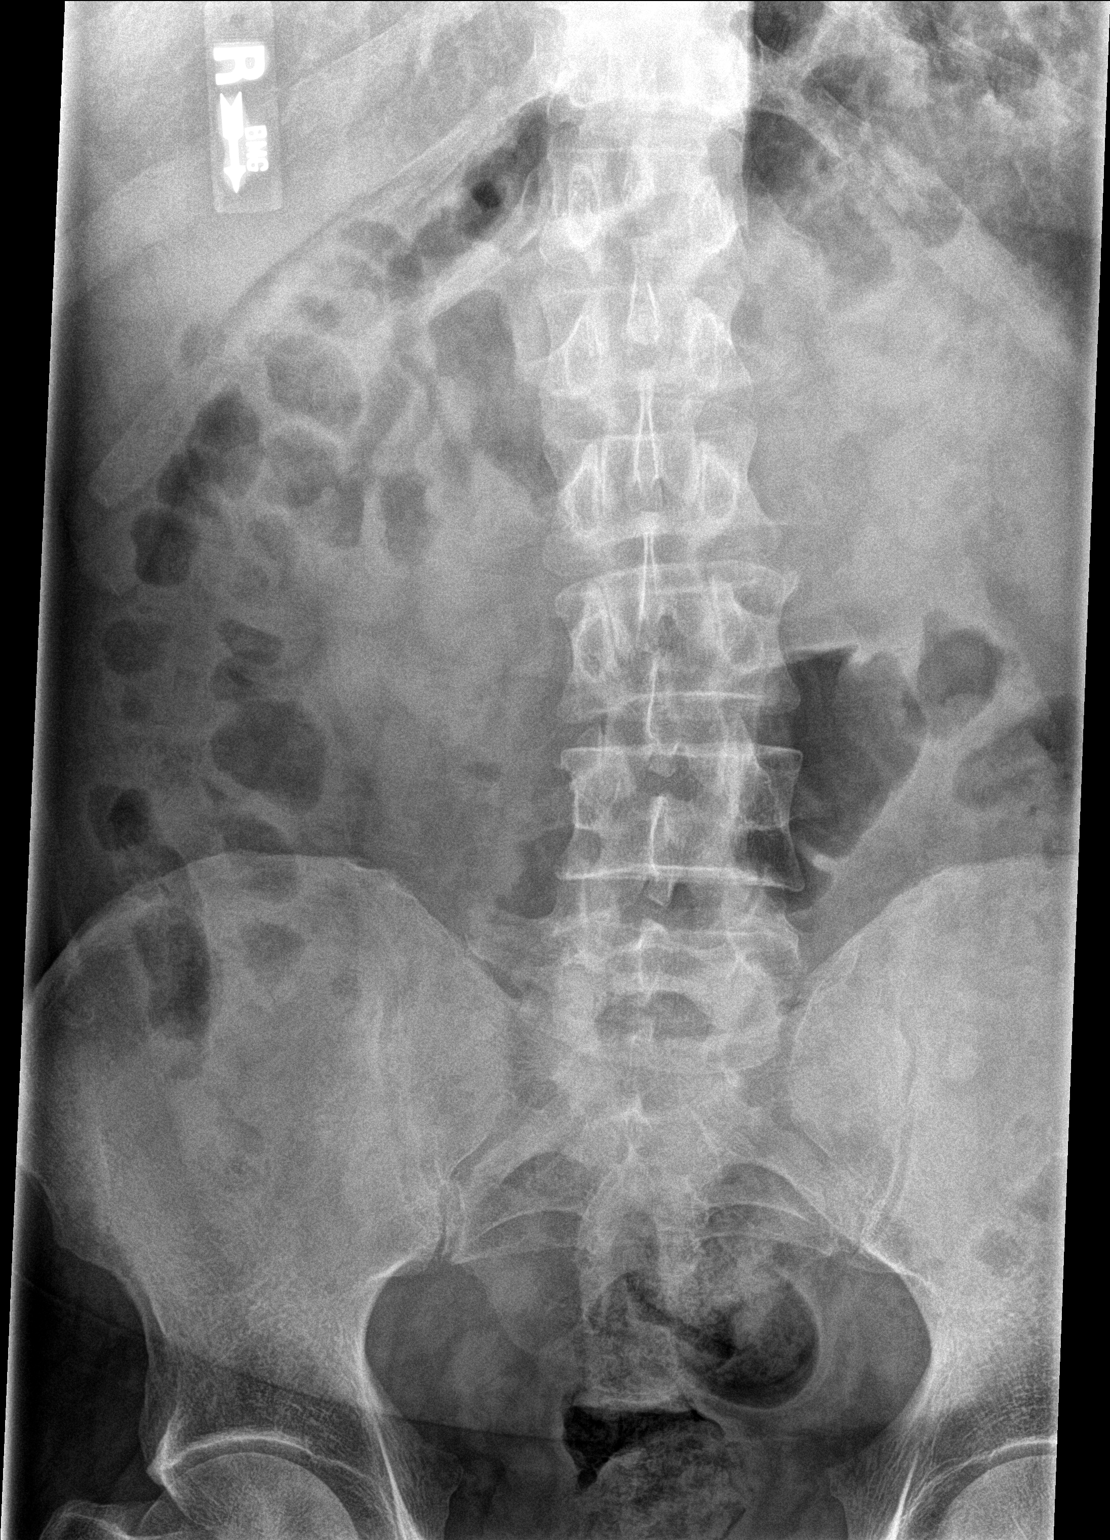

[l-spine lat]
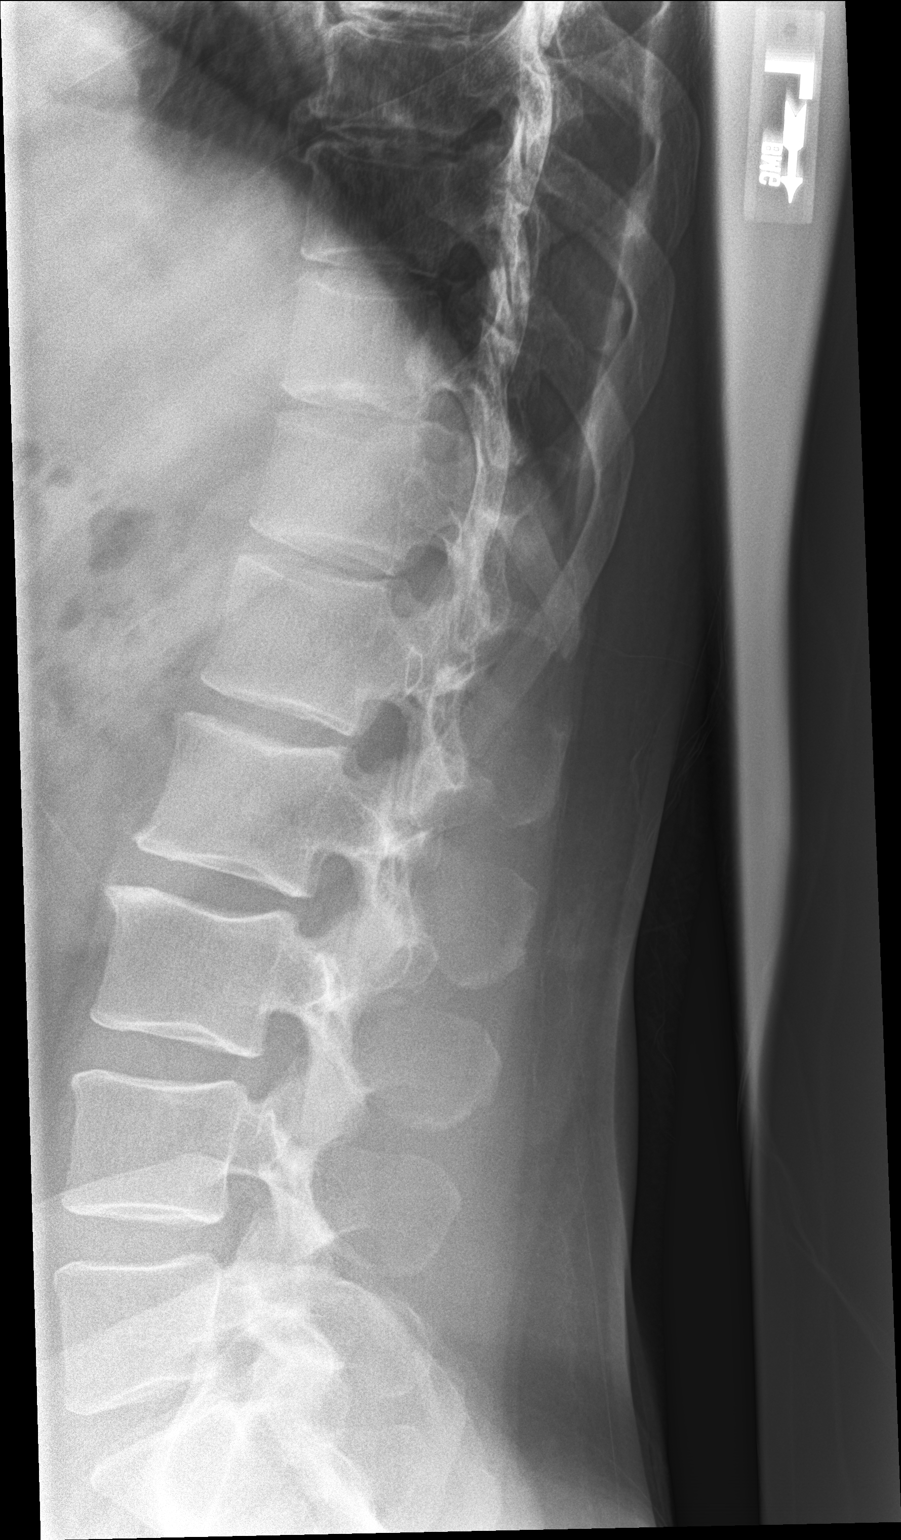

[l-spine l5-s1]
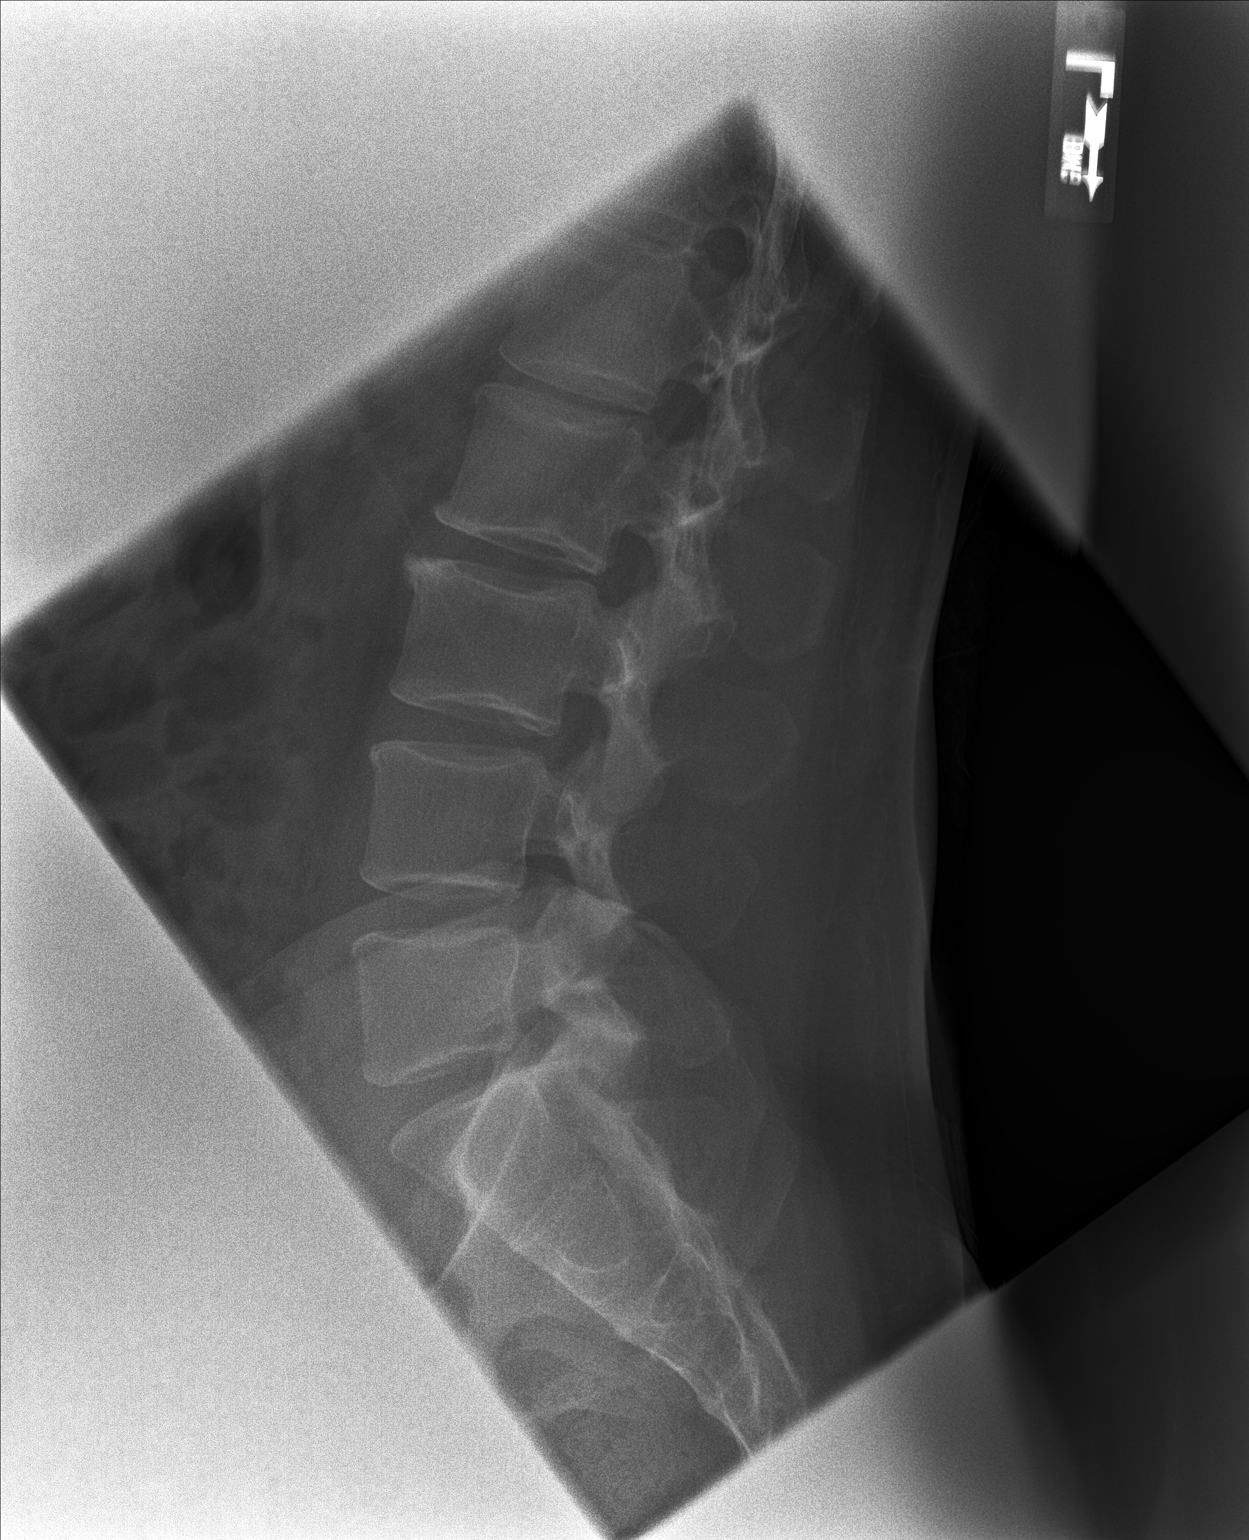

[3 of 3 positions shown; findings below may reference images not displayed]

FINDINGS: Frontal, lateral, and spot lumbosacral lateral images were obtained.
There are 5 non-rib-bearing lumbar type vertebral bodies. There is
stable mid lumbar levoscoliosis. There is no fracture or
spondylolisthesis. The disc spaces appear unremarkable. No erosive
change.
IMPRESSION: Stable levoscoliosis. No fracture or spondylolisthesis. No
appreciable disc space narrowing.
